# Patient Record
Sex: Male | Born: 1995 | Race: White | Hispanic: No | Marital: Single | Smoking: Current every day smoker
Health system: Southern US, Community
[De-identification: ages and names within clinical notes are randomized; demographics above are authoritative.]

## PROBLEM LIST (undated history)

## (undated) DIAGNOSIS — M67431 Ganglion, right wrist: Secondary | ICD-10-CM

## (undated) DIAGNOSIS — B2790 Infectious mononucleosis, unspecified without complication: Secondary | ICD-10-CM

## (undated) DIAGNOSIS — T7840XA Allergy, unspecified, initial encounter: Secondary | ICD-10-CM

## (undated) HISTORY — DX: Ganglion, right wrist: M67.431

## (undated) HISTORY — DX: Allergy, unspecified, initial encounter: T78.40XA

## (undated) HISTORY — PX: GANGLION CYST EXCISION: SHX1691

---
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
## 2016-11-21 ENCOUNTER — Ambulatory Visit: Payer: Medicaid Other | Admitting: Physician Assistant

## 2016-11-29 ENCOUNTER — Ambulatory Visit (INDEPENDENT_AMBULATORY_CARE_PROVIDER_SITE_OTHER): Payer: Self-pay | Admitting: Physician Assistant

## 2016-11-29 ENCOUNTER — Encounter: Payer: Self-pay | Admitting: Physician Assistant

## 2016-11-29 VITALS — BP 134/86 | HR 67 | Ht 69.0 in | Wt 169.0 lb

## 2016-11-29 DIAGNOSIS — M67431 Ganglion, right wrist: Secondary | ICD-10-CM | POA: Insufficient documentation

## 2016-11-29 DIAGNOSIS — Z0000 Encounter for general adult medical examination without abnormal findings: Secondary | ICD-10-CM

## 2016-11-29 NOTE — Patient Instructions (Addendum)
Follow-up with Dr. Darene Lamer (sports medicine) as needed for injections of your ganglion cyst  Preventive Care 18-39 Years, Male Preventive care refers to lifestyle choices and visits with your health care provider that can promote health and wellness. What does preventive care include?  A yearly physical exam. This is also called an annual well check.  Dental exams once or twice a year.  Routine eye exams. Ask your health care provider how often you should have your eyes checked.  Personal lifestyle choices, including:  Daily care of your teeth and gums.  Regular physical activity.  Eating a healthy diet.  Avoiding tobacco and drug use.  Limiting alcohol use.  Practicing safe sex. What happens during an annual well check? The services and screenings done by your health care provider during your annual well check will depend on your age, overall health, lifestyle risk factors, and family history of disease. Counseling  Your health care provider may ask you questions about your:  Alcohol use.  Tobacco use.  Drug use.  Emotional well-being.  Home and relationship well-being.  Sexual activity.  Eating habits.  Work and work Statistician. Screening  You may have the following tests or measurements:  Height, weight, and BMI.  Blood pressure.  Lipid and cholesterol levels. These may be checked every 5 years starting at age 25.  Diabetes screening. This is done by checking your blood sugar (glucose) after you have not eaten for a while (fasting).  Skin check.  Hepatitis C blood test.  Hepatitis B blood test.  Sexually transmitted disease (STD) testing. Discuss your test results, treatment options, and if necessary, the need for more tests with your health care provider. Vaccines  Your health care provider may recommend certain vaccines, such as:  Influenza vaccine. This is recommended every year.  Tetanus, diphtheria, and acellular pertussis (Tdap, Td) vaccine.  You may need a Td booster every 10 years.  Varicella vaccine. You may need this if you have not been vaccinated.  HPV vaccine. If you are 20 or younger, you may need three doses over 6 months.  Measles, mumps, and rubella (MMR) vaccine. You may need at least one dose of MMR.You may also need a second dose.  Pneumococcal 13-valent conjugate (PCV13) vaccine. You may need this if you have certain conditions and have not been vaccinated.  Pneumococcal polysaccharide (PPSV23) vaccine. You may need one or two doses if you smoke cigarettes or if you have certain conditions.  Meningococcal vaccine. One dose is recommended if you are age 20-20 years and a first-year college student living in a residence hall, or if you have one of several medical conditions. You may also need additional booster doses.  Hepatitis A vaccine. You may need this if you have certain conditions or if you travel or work in places where you may be exposed to hepatitis A.  Hepatitis B vaccine. You may need this if you have certain conditions or if you travel or work in places where you may be exposed to hepatitis B.  Haemophilus influenzae type b (Hib) vaccine. You may need this if you have certain risk factors. Talk to your health care provider about which screenings and vaccines you need and how often you need them. This information is not intended to replace advice given to you by your health care provider. Make sure you discuss any questions you have with your health care provider. Document Released: 11/27/2001 Document Revised: 06/20/2016 Document Reviewed: 08/02/2015 Elsevier Interactive Patient Education  2017 [employer].

## 2016-11-29 NOTE — Progress Notes (Signed)
HPI:                                                                Seth SnooksBrandan Torres is a 21 y.o. male [facility] today to establish care  Current Concerns include right ganglion cyst on dorsum of wrist that has been present for 8 years. It causes him intermittent pain. He would like to have this removed if possible.  He has no known medical problems. No surgeries. He has no family history of HTN, DMII, heart disease or cancer to his knowledge.  Currently sexually active with 1 male partner. Declines STI testing.  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  Health Maintenance Health Maintenance  Topic Date Due  . HIV Screening  09/30/2012  . TETANUS/TDAP  09/30/2016  . INFLUENZA VACCINE  06/15/2018 (Originally 05/15/2016)     Health Habits  Diet: good  Exercise: gym 1 hour daily  ETOH: no  Tobacco: e-cigarettes  Drugs: no  No past medical history on file. No past surgical history on file. Social History  Substance Use Topics  . Smoking status: Current Every Day Smoker    Types: E-cigarettes  . Smokeless tobacco: Never Used  . Alcohol use Yes   family history is not on file.  ROS: negative except as noted in the HPI  Medications: No current outpatient prescriptions on file.   No current facility-administered medications for this visit.    No Known Allergies     Objective:  BP 134/86   Pulse 67   Ht 5\' 9"  (1.753 m)   Wt 169 lb (76.7 kg)   BMI 24.96 kg/m  Gen: well-groomed, cooperative, not ill-appearing, no distress HEENT: normal conjunctiva, TM's clear, oropharynx clear, moist mucus membranes, no thyromegaly or tenderness Pulm: Normal work of breathing, clear to auscultation bilaterally CV: Normal rate, regular rhythm, s1 and s2 distinct, no murmurs, clicks or rubs appreciated on this exam, no carotid bruit GI: soft, nondistended, nontender, no masses Neuro: alert and oriented x 3, EOM's  intact, PERRLA, DTR's intact MSK: strength 5/5 and symmetric in upper and lower extremities, normal gait and station, distal pulses intact, no peripheral edema Right Wrist: visible and palpable ganglion cyst on dorsum (see Dr. Lucienne Minkshekkekandam's note) Skin: warm and dry, no rashes or lesions on exposed skin Psych: flat affect, pleasant mood, normal speech and thought content   No results found for this or any previous visit (from the past 72 hour(s)). No results found.    Assessment and Plan: 21 y.o. male with   Ganglion cyst of dorsum of right wrist - Kenalog/Lidocaine injection performed under ultrasound guidance by Dr. Velva Harmanhekkakandam - work note provided - follow-up with Sports Medicine as needed   Encounter for preventive care - Tetanus up to date - Declined influenza vaccine - Declined STI testing - patient is self-pay. Will defer screening lipid panel and CMP   No orders of the defined types were placed in this encounter.   Patient education and anticipatory guidance given Patient agrees with treatment plan Follow-up in 1 year or sooner as needed  Levonne Hubertharley E. Valma Rotenberg PA-C

## 2016-11-29 NOTE — Assessment & Plan Note (Signed)
Injection as above. Return in one month. 

## 2016-11-29 NOTE — Progress Notes (Signed)
   Subjective:    I'm seeing this patient as a consultation for:  Seth Frayharley Cummings, PA-C  CC: Right wrist ganglion cyst  HPI: This is a pleasant 21 year old male, he comes in with a long history of a ganglion cyst over the dorsum of his right wrist. He's tried sticking needles in it but unfortunately recurs, there is mild pain, moderate, persistent without radiation.  Past medical history:  Negative.  See flowsheet/record as well for more information.  Surgical history: Negative.  See flowsheet/record as well for more information.  Family history: Negative.  See flowsheet/record as well for more information.  Social history: Negative.  See flowsheet/record as well for more information.  Allergies, and medications have been entered into the medical record, reviewed, and no changes needed.   Review of Systems: No headache, visual changes, nausea, vomiting, diarrhea, constipation, dizziness, abdominal pain, skin rash, fevers, chills, night sweats, weight loss, swollen lymph nodes, body aches, joint swelling, muscle aches, chest pain, shortness of breath, mood changes, visual or auditory hallucinations.   Objective:   General: Well Developed, well nourished, and in no acute distress.  Neuro/Psych: Alert and oriented x3, extra-ocular muscles intact, able to move all 4 extremities, sensation grossly intact. Skin: Warm and dry, no rashes noted.  Respiratory: Not using accessory muscles, speaking in full sentences, trachea midline.  Cardiovascular: Pulses palpable, no extremity edema. Abdomen: Does not appear distended. Right Wrist: Visible and palpable dorsal ganglion in close approximation with a fourth extensor compartment ROM smooth and normal with good flexion and extension and ulnar/radial deviation that is symmetrical with opposite wrist. Palpation is normal over metacarpals, navicular, lunate, and TFCC; tendons without tenderness/ swelling No snuffbox tenderness. No tenderness over  [place]. Strength 5/5 in all directions without pain. Negative Finkelstein, tinel's and phalens. Negative Watson's test.  Procedure: Real-time Ultrasound Guided injection and fenestration of right dorsal wrist ganglion cyst Device: GE Logiq E  Verbal informed consent obtained.  Time-out conducted.  Noted no overlying erythema, induration, or other signs of local infection.  Skin prepped in a sterile fashion.  Local anesthesia: Topical Ethyl chloride.  With sterile technique and under real time ultrasound guidance:  Using a 25-gauge needle advanced into the ganglion cyst, injected 1/2 mL kenalog 40, 1/2 mL lidocaine, I then made several fenestrations to the cyst. Completed without difficulty  Pain immediately resolved suggesting accurate placement of the medication.  Advised to call if fevers/chills, erythema, induration, drainage, or persistent bleeding.  Images permanently stored and available for review in the ultrasound unit.  Impression: Technically successful ultrasound guided injection.  Impression and Recommendations:   This case required medical decision making of moderate complexity.  Ganglion cyst of dorsum of right wrist Injection as above. Return in one month.

## 2017-01-25 ENCOUNTER — Ambulatory Visit (INDEPENDENT_AMBULATORY_CARE_PROVIDER_SITE_OTHER): Payer: Self-pay | Admitting: Physician Assistant

## 2017-01-25 VITALS — BP 130/80 | HR 46 | Wt 164.0 lb

## 2017-01-25 DIAGNOSIS — S90212A Contusion of left great toe with damage to nail, initial encounter: Secondary | ICD-10-CM

## 2017-01-25 NOTE — Progress Notes (Signed)
HPI:                                                                Seth Torres is a 21 y.o. male who presents to [facility]: [facility] Medicine today for left toe injury  Patient reports 6 weeks ago a metal box fell on his left great toe. He had pain and bruising, but full ROM and was able to walk on his foot. He presents today because his left great toe is black and blue and he is wondering if his toe nail is supposed to fall off. He denies any pain, swelling, warmth, or erythema. He does endorse athlete's foot of the affected foot.  Past Medical History:  Diagnosis Date  . Ganglion cyst of dorsum of right wrist    No past surgical history on file. Social History  Substance Use Topics  . Smoking status: Current Every Day Smoker    Types: E-cigarettes  . Smokeless tobacco: Never Used  . Alcohol use Yes   family history is not on file.  ROS: negative except as noted in the HPI  Medications: No current outpatient prescriptions on file.   No current facility-administered medications for this visit.    No Known Allergies     Objective:  BP 130/80   Pulse (!) 46   Wt 164 lb (74.4 kg)   BMI 24.22 kg/m  Physical Exam  Musculoskeletal:       Feet:  Subungual hematoma of left great toe, nontender, brisk capillary refill, Left foot otherwise without deformity or tenderness; Normal gait and station, no peripheral edema  Skin: Skin is warm, dry and intact. Rash (scaling and erythema of the dorsum of the left forefoot) noted.      No results found for this or any previous visit (from the past 72 hour(s)). No results found.    Assessment and Plan: 21 y.o. male with   1. Subungual hematoma of great toe of left foot, initial encounter - asymptomatic, not necessary to perform decompression - reassurance provided  Patient education and anticipatory guidance given Patient agrees with treatment plan Follow-up as needed if symptoms worsen  or fail to improve  Levonne Hubert PA-C

## 2017-03-07 ENCOUNTER — Ambulatory Visit (INDEPENDENT_AMBULATORY_CARE_PROVIDER_SITE_OTHER): Payer: Self-pay | Admitting: Physician Assistant

## 2017-03-07 ENCOUNTER — Ambulatory Visit (INDEPENDENT_AMBULATORY_CARE_PROVIDER_SITE_OTHER): Payer: Self-pay

## 2017-03-07 VITALS — BP 145/75 | HR 76 | Temp 98.0°F | Wt 162.0 lb

## 2017-03-07 DIAGNOSIS — S99922A Unspecified injury of left foot, initial encounter: Secondary | ICD-10-CM

## 2017-03-07 DIAGNOSIS — S92155A Nondisplaced avulsion fracture (chip fracture) of left talus, initial encounter for closed fracture: Secondary | ICD-10-CM | POA: Insufficient documentation

## 2017-03-07 DIAGNOSIS — X58XXXA Exposure to other specified factors, initial encounter: Secondary | ICD-10-CM

## 2017-03-07 DIAGNOSIS — S92152A Displaced avulsion fracture (chip fracture) of left talus, initial encounter for closed fracture: Secondary | ICD-10-CM

## 2017-03-07 NOTE — Progress Notes (Signed)
Pt was running down stairs yesterday around 5 pm, and left foot became very painful.  Is bruised across the top, and has pain with any movement.  The pain is located across the top, and along lateral side.

## 2017-03-07 NOTE — Patient Instructions (Addendum)
-   ASO brace (lace-up ankle brace) - Ice x 20 mins 3-4 times per day (do not apply ice directly to skin - use a thin towel) - Elevate injured foot - Continue range of motion as much as you can without pain (do the ABC's with your foot) - Ibuprofen 600mg  every 8 hours as needed for pain   Ankle Sprain An ankle sprain is a stretch or tear in one of the tough tissues (ligaments) in your ankle. Follow these instructions at home:  Rest your ankle.  Take over-the-counter and prescription medicines only as told by your doctor.  For 2-3 days, keep your ankle higher than the level of your heart (elevated) as much as possible.  If directed, put ice on the area:  Put ice in a plastic bag.  Place a towel between your skin and the bag.  Leave the ice on for 20 minutes, 2-3 times a day.  If you were given a brace:  Wear it as told.  Take it off to shower or bathe.  Try not to move your ankle much, but wiggle your toes from time to time. This helps to prevent swelling.  If you were given an elastic bandage (dressing):  Take it off when you shower or bathe.  Try not to move your ankle much, but wiggle your toes from time to time. This helps to prevent swelling.  Adjust the bandage to make it more comfortable if it feels too tight.  Loosen the bandage if you lose feeling in your foot, your foot tingles, or your foot gets cold and blue.  If you have crutches, use them as told by your doctor. Continue to use them until you can walk without feeling pain in your ankle. Contact a doctor if:  Your bruises or swelling are quickly getting worse.  Your pain does not get better after you take medicine. Get help right away if:  You cannot feel your toes or foot.  Your toes or your foot looks blue.  You have very bad pain that gets worse. This information is not intended to replace advice given to you by your health care provider. Make sure you discuss any questions you have with your  health care provider. Document Released: 03/19/2008 Document Revised: 03/08/2016 Document Reviewed: 05/03/2015 Elsevier Interactive Patient Education  2017 [employer]

## 2017-03-07 NOTE — Progress Notes (Addendum)
HPI:                                                                Seth SnooksBrandan Torres is a 21 y.o. male who [facility] today for left foot pain  Foot Injury   The incident occurred 12 to 24 hours ago. The incident occurred at home. The injury mechanism was a fall (running down steps). The pain is present in the left foot. Pertinent negatives include no inability to bear weight, loss of sensation, numbness or tingling. He reports no foreign bodies present. The symptoms are aggravated by palpation and weight bearing. He has tried ice for the symptoms.     Past Medical History:  Diagnosis Date  . Ganglion cyst of dorsum of right wrist    No past surgical history on file. Social History  Substance Use Topics  . Smoking status: Current Every Day Smoker    Types: E-cigarettes  . Smokeless tobacco: Never Used  . Alcohol use Yes   family history is not on file.  ROS: negative except as noted in the HPI  Medications: No current outpatient prescriptions on file.   No current facility-administered medications for this visit.    No Known Allergies     Objective:  BP (!) 145/75 (BP Location: Right Arm, Patient Position: Sitting, Cuff Size: Large)   Pulse 76   Temp 98 F (36.7 C) (Oral)   Wt 162 lb (73.5 kg)   SpO2 99%   BMI 23.92 kg/m  Gen: well-groomed, cooperative, not ill-appearing, no distress Pulm: Normal work of breathing, normal phonation CV: DP and PT pulses intact Neuro: sensation intact MSK: left foot/ankle: nontender over medial and lateral malleoli, there is tenderness over 5th metatarsal head as well as the anterior talofibular ligament, ROM intact, joint is stable, negative anterior drawer, positive talar tilt test, no ecchymoses; moving all extremities, normal gait and station, no peripheral edema Lymph: no cervical or tonsillar adenopathy Skin: warm, dry, intact; no rashes or lesions on exposed skin, no  cyanosis   No results found for this or any previous visit (from the past 72 hour(s)). Dg Foot Complete Left  Result Date: 03/07/2017 CLINICAL DATA:  Foot injury.  Left fifth metacarpal pain. EXAM: LEFT FOOT - COMPLETE 3+ VIEW COMPARISON:  None. FINDINGS: Small bone fragment noted along the dorsum of the distal talus on the lateral view, likely small avulsed fragment. No additional bony abnormality. No subluxation or dislocation. Soft tissues are intact. IMPRESSION: Small avulsed fragment off the dorsal distal talus. Electronically Signed   By: Charlett NoseKevin  Dover M.D.   On: 03/07/2017 10:57      Assessment and Plan: 21 y.o. male with   1. Closed nondisplaced avulsion fracture of left talus, initial encounter - DG Foot Complete Left showing an avulsion of the distal talus - post-op shoe - work note provided  Patient education and anticipatory guidance given Patient agrees with treatment plan Follow-up with Sports Medicine in 2 weeks or sooner as needed if symptoms worsen or fail to improve  Levonne Hubertharley E. Sandee Bernath PA-C

## 2017-03-18 ENCOUNTER — Ambulatory Visit (INDEPENDENT_AMBULATORY_CARE_PROVIDER_SITE_OTHER): Payer: Self-pay | Admitting: Sports Medicine

## 2017-03-18 DIAGNOSIS — S92155D Nondisplaced avulsion fracture (chip fracture) of left talus, subsequent encounter for fracture with routine healing: Secondary | ICD-10-CM

## 2017-03-18 NOTE — Assessment & Plan Note (Signed)
Minimal pain. We can treat this like a sprain at this point. ASO, rehabilitation exercises. 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

## 2017-03-18 NOTE — Progress Notes (Signed)
   Subjective:    I'm seeing this patient as a consultation for:  Gena Frayharley Cummings, PA-C  CC: Left ankle injury  HPI: This is a pleasant 21 year old male, he inverted his foot recently, had immediate pain and swelling, x-ray showed a small avulsion from the dorsal lateral talus. Overall his pain is improved and [place], mild, improving. No radiation.  Past medical history:  Negative.  See flowsheet/record as well for more information.  Surgical history: Negative.  See flowsheet/record as well for more information.  Family history: Negative.  See flowsheet/record as well for more information.  Social history: Negative.  See flowsheet/record as well for more information.  Allergies, and medications have been entered into the medical record, reviewed, and no changes needed.   Review of Systems: No headache, visual changes, nausea, vomiting, diarrhea, constipation, dizziness, abdominal pain, skin rash, fevers, chills, night sweats, weight loss, swollen lymph nodes, body aches, joint swelling, muscle aches, chest pain, shortness of breath, mood changes, visual or auditory hallucinations.   Objective:   General: Well Developed, well nourished, and in no acute distress.  Neuro/Psych: Alert and oriented x3, extra-ocular muscles intact, able to move all 4 extremities, sensation grossly intact. Skin: Warm and dry, no rashes noted.  Respiratory: Not using accessory muscles, speaking in full sentences, trachea midline.  Cardiovascular: Pulses palpable, no extremity edema. Abdomen: Does not appear distended. Left Ankle: No visible erythema or swelling. Range of motion is full in all directions. Strength is 5/5 in all directions. Stable lateral and medial ligaments; squeeze test and kleiger test unremarkable; Minimal tenderness over the dorsolateral talus No pain at base of 5th MT; No tenderness over cuboid; No tenderness over N spot or navicular prominence No tenderness on posterior aspects of  lateral and medial malleolus No sign of peroneal tendon subluxations; Negative tarsal tunnel tinel's Able to walk 4 steps.  Impression and Recommendations:   This case required medical decision making of moderate complexity.  Closed nondisplaced avulsion fracture of left talus Minimal pain. We can treat this like a sprain at this point. ASO, rehabilitation exercises. 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

## 2017-04-08 ENCOUNTER — Ambulatory Visit (INDEPENDENT_AMBULATORY_CARE_PROVIDER_SITE_OTHER): Payer: Self-pay

## 2017-04-08 ENCOUNTER — Encounter: Payer: Self-pay | Admitting: Family Medicine

## 2017-04-08 ENCOUNTER — Ambulatory Visit (INDEPENDENT_AMBULATORY_CARE_PROVIDER_SITE_OTHER): Payer: Self-pay | Admitting: Family Medicine

## 2017-04-08 VITALS — BP 127/75 | HR 66 | Wt 161.0 lb

## 2017-04-08 DIAGNOSIS — X501XXA Overexertion from prolonged static or awkward postures, initial encounter: Secondary | ICD-10-CM

## 2017-04-08 DIAGNOSIS — S92155A Nondisplaced avulsion fracture (chip fracture) of left talus, initial encounter for closed fracture: Secondary | ICD-10-CM

## 2017-04-08 NOTE — Patient Instructions (Signed)
Thank you for coming in today. I think this is more like an ankle sprain.   Advance activity as tolerated.    Ankle Sprain, Phase I Rehab Ask your health care provider which exercises are safe for you. Do exercises exactly as told by your health care provider and adjust them as directed. It is normal to feel mild stretching, pulling, tightness, or discomfort as you do these exercises, but you should stop right away if you feel sudden pain or your pain gets worse.Do not begin these exercises until told by your health care provider. Stretching and range of motion exercises These exercises warm up your muscles and joints and improve the movement and flexibility of your lower leg and ankle. These exercises also help to relieve pain and stiffness. Exercise A: Gastroc and soleus stretch  1. Sit on the floor with your left / right leg extended. 2. Loop a belt or towel around the ball of your left / right foot. The ball of your foot is on the walking surface, right under your toes. 3. Keep your left / right ankle and foot relaxed and keep your knee straight while you use the belt or towel to pull your foot toward you. You should feel a gentle stretch behind your calf or knee. 4. Hold this position for __________ seconds, then release to the starting position. Repeat the exercise with your knee bent. You can put a pillow or a rolled bath towel under your knee to support it. You should feel a stretch deep in your calf or at your Achilles tendon. Repeat each stretch __________ times. Complete these stretches __________ times a day. Exercise B: Ankle alphabet  1. Sit with your left / right leg supported at the lower leg. ? Do not rest your foot on anything. ? Make sure your foot has room to move freely. 2. Think of your left / right foot as a paintbrush, and move your foot to trace each letter of the alphabet in the air. Keep your hip and knee still while you trace. Make the letters as large as you can  without feeling discomfort. 3. Trace every letter from A to Z. Repeat __________ times. Complete this exercise __________ times a day. Strengthening exercises These exercises build strength and endurance in your ankle and lower leg. Endurance is the ability to use your muscles for a long time, even after they get tired. Exercise C: Dorsiflexors  1. Secure a rubber exercise band or tube to an object, such as a table leg, that will stay still when the band is pulled. Secure the other end around your left / right foot. 2. Sit on the floor facing the object, with your left / right leg extended. The band or tube should be slightly tense when your foot is relaxed. 3. Slowly bring your foot toward you, pulling the band tighter. 4. Hold this position for __________ seconds. 5. Slowly return your foot to the starting position. Repeat __________ times. Complete this exercise __________ times a day. Exercise D: Plantar flexors  1. Sit on the floor with your left / right leg extended. 2. Loop a rubber exercise tube or band around the ball of your left / right foot. The ball of your foot is on the walking surface, right under your toes. ? Hold the ends of the band or tube in your hands. ? The band or tube should be slightly tense when your foot is relaxed. 3. Slowly point your foot and toes downward, pushing  them away from you. 4. Hold this position for __________ seconds. 5. Slowly return your foot to the starting position. Repeat __________ times. Complete this exercise __________ times a day. Exercise E: Evertors 1. Sit on the floor with your legs straight out in front of you. 2. Loop a rubber exercise band or tube around the ball of your left / right foot. The ball of your foot is on the walking surface, right under your toes. ? Hold the ends of the band in your hands, or secure the band to a stable object. ? The band or tube should be slightly tense when your foot is relaxed. 3. Slowly push your  foot outward, away from your other leg. 4. Hold this position for __________ seconds. 5. Slowly return your foot to the starting position. Repeat __________ times. Complete this exercise __________ times a day. This information is not intended to replace advice given to you by your health care provider. Make sure you discuss any questions you have with your health care provider. Document Released: 05/02/2005 Document Revised: 06/07/2016 Document Reviewed: 08/15/2015 Elsevier Interactive Patient Education  2018 [employer].

## 2017-04-08 NOTE — Progress Notes (Signed)
   Seth Torres is a 21 y.o. male who [facility] today for left foot injury. Patient has been seen several times for symptoms consistent with an ankle sprain involving his left foot. This was eventually worked up as an avulsion fracture at the lateral dorsal talar neck. He used quite well until recently when he resumed running. He tripped or twisted his ankle and felt a pop and return of pain. He has pain is located in the anterior lateral dorsal midfoot. He has not tried any treatment yet.   Past Medical History:  Diagnosis Date  . Ganglion cyst of dorsum of right wrist    No past surgical history on file. Social History  Substance Use Topics  . Smoking status: Current Every Day Smoker    Types: E-cigarettes  . Smokeless tobacco: Never Used  . Alcohol use Yes     ROS:  As above   Medications: No current outpatient prescriptions on file.   No current facility-administered medications for this visit.    No Known Allergies   Exam:  BP 127/75   Pulse 66   Wt 161 lb (73 kg)   SpO2 99%   BMI 23.78 kg/m  General: Well Developed, well nourished, and in no acute distress.  Neuro/Psych: Alert and oriented x3, extra-ocular muscles intact, able to move all 4 extremities, sensation grossly intact. Skin: Warm and dry, no rashes noted.  Respiratory: Not using accessory muscles, speaking in full sentences, trachea midline.  Cardiovascular: Pulses palpable, no extremity edema. Abdomen: Does not appear distended. MSK: Left foot slightly swollen and tender at the dorsal lateral midfoot. Stable ligamentous exam. Pulses capillary refill and sensation are intact distally    No results found for this or any previous visit (from the past 48 hour(s)). Dg Ankle Complete Left  Result Date: 04/08/2017 CLINICAL DATA:  Twisting injury while running yesterday with pain, initial encounter EXAM: LEFT ANKLE COMPLETE - 3+ VIEW COMPARISON:   03/07/2017 FINDINGS: Small avulsions are again identified at the dorsal aspect of the talus stable from the previous exam. Mild soft tissue swelling is noted in this region as well. No other bony abnormality is seen. IMPRESSION: Small avulsions from the distal talus as described. Soft tissue swelling is noted as well. Electronically Signed   By: Alcide CleverMark  Lukens M.D.   On: 04/08/2017 16:19      Assessment and Plan: 21 y.o. male with likely reinjury of healing ATFL tendon avulsion.  Plan to resume mobilization with a ankle brace and resume gradual activity as tolerated. Recommend against running for at least a month.    Orders Placed This Encounter  Procedures  . DG Ankle Complete Left    Standing Status:   Future    Number of Occurrences:   1    Standing Expiration Date:   06/08/2018    Order Specific Question:   Reason for Exam (SYMPTOM  OR DIAGNOSIS REQUIRED)    Answer:   re-injury likely talus anvusion    Order Specific Question:   Preferred imaging location?    [facility]    Order Specific Question:   Radiology Contrast Protocol - do NOT remove file path    Answer:   [email or web address]   No orders of the defined types were placed in this encounter.   Discussed warning signs or symptoms. Please see discharge instructions. Patient expresses understanding.

## 2017-04-16 ENCOUNTER — Emergency Department
Admission: EM | Admit: 2017-04-16 | Discharge: 2017-04-16 | Disposition: A | Discharge status: Home / Self Care | Payer: Self-pay | Source: Home / Self Care | Attending: Family Medicine | Admitting: Family Medicine

## 2017-04-16 ENCOUNTER — Encounter: Payer: Self-pay | Admitting: *Deleted

## 2017-04-16 DIAGNOSIS — L03111 Cellulitis of right axilla: Secondary | ICD-10-CM

## 2017-04-16 MED ORDER — CLINDAMYCIN HCL 300 MG PO CAPS: ORAL_CAPSULE | ORAL | 0 refills | Status: DC

## 2017-04-16 NOTE — ED Provider Notes (Signed)
[facility]    CSN: [id number] Arrival date & time: 04/16/17  1716     History   Chief Complaint Chief Complaint  Patient presents with  . Abscess    HPI Seth Torres is a 21 y.o. male.   Patient complains of two painful nodules in his right axilla that appeared two days ago.  No fevers, chills, and sweats.  No drainage from the lesions.   The history is provided by the patient.  Abscess  Abscess location: right axilla. Size:  1cm Abscess quality: induration and painful   Abscess quality: not draining, no fluctuance, no redness, no warmth and not weeping   Red streaking: no   Duration:  2 days Progression:  Worsening Pain details:    Quality:  Dull and pressure   Severity:  Mild   Timing:  Constant   Progression:  Worsening Chronicity:  New Context: not insect bite/sting and not skin injury   Relieved by:  None tried Exacerbated by: contact. Ineffective treatments:  None tried Associated symptoms: no fatigue, no fever and no nausea   Risk factors: no prior abscess     Past Medical History:  Diagnosis Date  . Ganglion cyst of dorsum of right wrist     Patient Active Problem List   Diagnosis Date Noted  . Closed nondisplaced avulsion fracture of left talus 03/07/2017  . Ganglion cyst of dorsum of right wrist 11/29/2016    History reviewed. No pertinent surgical history.     Home Medications    Prior to Admission medications   Medication Sig Start Date End Date Taking? Authorizing Provider  clindamycin (CLEOCIN) 300 MG capsule Take one cap by mouth every 8 hours 04/16/17   Lattie HawBeese, Misk Galentine A, MD    Family History History reviewed. No pertinent family history.  Social History Social History  Substance Use Topics  . Smoking status: Current Every Day Smoker    Types: E-cigarettes  . Smokeless tobacco: Never Used  . Alcohol use Yes     Allergies   Patient has no known allergies.   Review of Systems Review of Systems    Constitutional: Negative for fatigue and fever.  Gastrointestinal: Negative for nausea.  All other systems reviewed and are negative.    Physical Exam Triage Vital Signs ED Triage Vitals  Enc Vitals Group     BP 04/16/17 1741 115/74     Pulse Rate 04/16/17 1741 (!) 52     Resp --      Temp 04/16/17 1741 98.3 F (36.8 C)     Temp Source 04/16/17 1741 Oral     SpO2 04/16/17 1741 98 %     Weight 04/16/17 1741 158 lb 6.4 oz (71.8 kg)     Height --      Head Circumference --      Peak Flow --      Pain Score 04/16/17 1742 8     Pain Loc --      Pain Edu? --      Excl. in GC? --    No data found.   Updated Vital Signs BP 115/74 (BP Location: Left Arm)   Pulse (!) 52   Temp 98.3 F (36.8 C) (Oral)   Wt 158 lb 6.4 oz (71.8 kg)   SpO2 98%   BMI 23.39 kg/m   Visual Acuity Right Eye Distance:   Left Eye Distance:   Bilateral Distance:    Right Eye Near:   Left Eye Near:  Bilateral Near:     Physical Exam  HENT:  Head: Normocephalic.  Right Ear: External ear normal.  Left Ear: External ear normal.  Nose: Nose normal.  Mouth/Throat: Oropharynx is clear and moist.  Eyes: Conjunctivae are normal. Pupils are equal, round, and reactive to light.  Neck: Neck supple.  Cardiovascular: Normal rate.   Pulmonary/Chest: Effort normal.  Lymphadenopathy:    He has no cervical adenopathy.  Neurological: He is alert.  Skin: Skin is warm and dry. No rash noted.  Right axilla reveals a 1cm diameter tender subcutaneous nodule without induration or fluctuance, and no overlying erythema.  There is an adjacent tender reactive axillary node.   Nursing note and vitals reviewed.    UC Treatments / Results  Labs (all labs ordered are listed, but only abnormal results are displayed) Labs Reviewed - No data to display  EKG  EKG Interpretation None       Radiology No results found.  Procedures Procedures (including critical care time)  Medications Ordered in  UC Medications - No data to display   Initial Impression / Assessment and Plan / UC Course  I have reviewed the triage vital signs and the nursing notes.  Pertinent labs & imaging results that were available during my care of the patient were reviewed by me and considered in my medical decision making (see chart for details).    Lesion right axilla indurated, but not fluctuant, and should respond to antibiotic. Begin Clindamycin 300mg  Q8hr. Apply warm compress 3 or 4 times daily.  May take Ibuprofen 200mg , 4 tabs every 8 hours with food.  Return if not improving 2 to 3 days; may need I and D.    Final Clinical Impressions(s) / UC Diagnoses   Final diagnoses:  Cellulitis of axilla, right    New Prescriptions New Prescriptions   CLINDAMYCIN (CLEOCIN) 300 MG CAPSULE    Take one cap by mouth every 8 hours     Lattie Haw, MD 04/24/17 [phone]

## 2017-04-16 NOTE — ED Triage Notes (Signed)
Patient c/o 2 days of possible abscess to right axilla. He feels otherwise well.

## 2017-04-16 NOTE — Discharge Instructions (Signed)
Apply warm compress 3 or 4 times daily.  May take Ibuprofen 200mg , 4 tabs every 8 hours with food.

## 2017-04-18 ENCOUNTER — Emergency Department (INDEPENDENT_AMBULATORY_CARE_PROVIDER_SITE_OTHER)
Admission: EM | Admit: 2017-04-18 | Discharge: 2017-04-18 | Disposition: A | Discharge status: Home / Self Care | Payer: Self-pay | Source: Home / Self Care | Attending: Family Medicine | Admitting: Family Medicine

## 2017-04-18 ENCOUNTER — Encounter: Payer: Self-pay | Admitting: Emergency Medicine

## 2017-04-18 DIAGNOSIS — L02411 Cutaneous abscess of right axilla: Secondary | ICD-10-CM

## 2017-04-18 MED ORDER — TRAMADOL HCL 50 MG PO TABS: 50.0000 mg | ORAL_TABLET | Freq: Four times a day (QID) | ORAL | 0 refills | Status: DC | PRN

## 2017-04-18 NOTE — Discharge Instructions (Signed)
°  Tramadol is strong pain medication. While taking, do not drink alcohol, drive, or perform any other activities that requires focus while taking these medications.  ° °Please take antibiotics as prescribed and be sure to complete entire course even if you start to feel better to ensure infection does not come back. ° °

## 2017-04-18 NOTE — ED Provider Notes (Signed)
CSN: [id number]     Arrival date & time 04/18/17  1804 History   First MD Initiated Contact with Patient 04/18/17 1813     Chief Complaint  Patient presents with  . Abscess   (Consider location/radiation/quality/duration/timing/severity/associated sxs/prior Treatment) HPI Seth Torres is a 21 y.o. male presenting to UC with c/o gradually worsening abscesses under Right axilla that started about 5 days ago. Pt was seen 2 days ago by Dr. Cathren Harsh and started on clindamycin. He has been using warm compresses and taking the antibiotic but he feels like the two spots are only getting bigger. No bleeding or drainage. Denies fever, chills, n/v/d. Pain is throbbing, 7/10. No prior hx of skin abscesses.    Past Medical History:  Diagnosis Date  . Ganglion cyst of dorsum of right wrist    History reviewed. No pertinent surgical history. No family history on file. Social History  Substance Use Topics  . Smoking status: Current Every Day Smoker    Types: E-cigarettes  . Smokeless tobacco: Never Used  . Alcohol use Yes    Review of Systems  Constitutional: Negative for chills and fever.  Musculoskeletal: Positive for myalgias (under Right axilla). Negative for arthralgias.  Skin: Positive for color change. Negative for rash and wound.    Allergies  Patient has no known allergies.  Home Medications   Prior to Admission medications   Medication Sig Start Date End Date Taking? Authorizing Provider  clindamycin (CLEOCIN) 300 MG capsule Take one cap by mouth every 8 hours 04/16/17   Lattie Haw, MD  traMADol (ULTRAM) 50 MG tablet Take 1 tablet (50 mg total) by mouth every 6 (six) hours as needed. 04/18/17   Lurene Shadow, PA-C   Meds Ordered and Administered this Visit  Medications - No data to display  BP 122/72 (BP Location: Left Arm)   Pulse (!) 55   Temp 97.7 F (36.5 C) (Oral)   Ht 5\' 8"  (1.727 m)   Wt 158 lb (71.7 kg)   SpO2 98%   BMI 24.02 kg/m  No data found.   Physical  Exam  Constitutional: He is oriented to person, place, and time. He appears well-developed and well-nourished.  HENT:  Head: Normocephalic and atraumatic.  Eyes: EOM are normal.  Neck: Normal range of motion.  Cardiovascular: Normal rate.   Pulmonary/Chest: Effort normal.  Musculoskeletal: Normal range of motion.  Neurological: He is alert and oriented to person, place, and time.  Skin: Skin is warm and dry. There is erythema.  Right axilla: 2cm area of erythema, and tenderness with centralized fluctuance. No bleeding or drainage Smaller 1cm area of erythema with centralized fluctuance. No bleeding or drainage. No red streaking.   Psychiatric: He has a normal mood and affect. His behavior is normal.  Nursing note and vitals reviewed.   Urgent Care Course     .Marland KitchenIncision and Drainage Date/Time: 04/18/2017 6:43 PM Performed by: Lurene Shadow Authorized by: Donna Christen A   Consent:    Consent obtained:  Verbal   Consent given by:  Patient   Risks discussed:  Bleeding, incomplete drainage and pain   Alternatives discussed:  Delayed treatment Location:    Type:  Abscess   Size:  2   Location:  Upper extremity   Upper extremity location:  Arm   Arm location:  R upper arm (Right axilla) Pre-procedure details:    Skin preparation:  Betadine Anesthesia (see MAR for exact dosages):    Anesthesia method:  Local  infiltration   Local anesthetic:  Lidocaine 1% WITH epi Procedure type:    Complexity:  Simple Procedure details:    Needle aspiration: no     Incision types:  Single straight   Incision depth:  Dermal   Scalpel blade:  11   Wound management:  Probed and deloculated and irrigated with saline   Drainage:  Bloody and purulent   Drainage amount:  Moderate   Wound treatment:  Wound left open (bandage applied)   Packing materials:  None Post-procedure details:    Patient tolerance of procedure:  Tolerated well, no immediate complications .Marland Kitchen.Incision and  Drainage Date/Time: 04/18/2017 6:44 PM Performed by: Lurene ShadowPHELPS, Berna Gitto O Authorized by: Donna ChristenBEESE, STEPHEN A   Consent:    Consent obtained:  Verbal   Consent given by:  Patient   Risks discussed:  Bleeding, incomplete drainage and pain   Alternatives discussed:  Delayed treatment Location:    Type:  Abscess   Size:  1   Location:  Upper extremity   Upper extremity location:  Arm   Arm location:  R upper arm (Right axilla) Pre-procedure details:    Skin preparation:  Betadine Procedure type:    Complexity:  Simple Procedure details:    Needle aspiration: no     Incision types:  Single straight   Incision depth:  Dermal   Scalpel blade:  11   Wound management:  Probed and deloculated and irrigated with saline   Drainage:  Bloody and purulent   Drainage amount:  Scant   Wound treatment:  Wound left open   Packing materials:  None Post-procedure details:    Patient tolerance of procedure:  Tolerated well, no immediate complications   (including critical care time)  Labs Review Labs Reviewed  WOUND CULTURE    Imaging Review No results found.   MDM   1. Abscess of right axilla    I & D of two small abscesses under Right axilla. No packing needed. Pt tolerated procedure well.  Encouraged to continue taking antibiotics as prescribed. Wound culture sent to lab  F/u in 2-3 days for recheck of symptoms if not improving. Home instructions provided. Rx: Tramadol for moderate to severe pain. Ibuprofen and acetaminophen during the day    Lurene Shadowhelps, Juan Kissoon O, [place]PA-C 04/18/17 1846

## 2017-04-18 NOTE — ED Triage Notes (Signed)
Abscess Right axilla x 5 days

## 2017-04-21 ENCOUNTER — Telehealth: Payer: Self-pay | Admitting: Family Medicine

## 2017-04-21 LAB — WOUND CULTURE
Gram Stain: NONE SEEN
Gram Stain: NONE SEEN

## 2017-04-21 NOTE — Telephone Encounter (Signed)
Informed pt of staph results and to continue with current antibiotics. Pt states he is feeling much better after having abscess drained. No questions or concerns at this time.

## 2018-01-02 ENCOUNTER — Encounter: Payer: Self-pay | Admitting: Physician Assistant

## 2018-01-02 ENCOUNTER — Ambulatory Visit (INDEPENDENT_AMBULATORY_CARE_PROVIDER_SITE_OTHER): Payer: Self-pay | Admitting: Physician Assistant

## 2018-01-02 ENCOUNTER — Ambulatory Visit: Payer: Self-pay | Admitting: Physician Assistant

## 2018-01-02 ENCOUNTER — Other Ambulatory Visit: Payer: Self-pay | Admitting: Physician Assistant

## 2018-01-02 VITALS — BP 135/81 | HR 66 | Temp 98.1°F | Wt 156.0 lb

## 2018-01-02 DIAGNOSIS — Z113 Encounter for screening for infections with a predominantly sexual mode of transmission: Secondary | ICD-10-CM

## 2018-01-02 DIAGNOSIS — Z7251 High risk heterosexual behavior: Secondary | ICD-10-CM

## 2018-01-02 DIAGNOSIS — J029 Acute pharyngitis, unspecified: Secondary | ICD-10-CM

## 2018-01-02 DIAGNOSIS — R011 Cardiac murmur, unspecified: Secondary | ICD-10-CM | POA: Insufficient documentation

## 2018-01-02 DIAGNOSIS — J039 Acute tonsillitis, unspecified: Secondary | ICD-10-CM

## 2018-01-02 DIAGNOSIS — J0391 Acute recurrent tonsillitis, unspecified: Secondary | ICD-10-CM | POA: Insufficient documentation

## 2018-01-02 MED ORDER — ACETAMINOPHEN 500 MG PO CAPS: 2.0000 | ORAL_CAPSULE | Freq: Three times a day (TID) | ORAL | 0 refills | Status: DC | PRN

## 2018-01-02 MED ORDER — PREDNISONE 20 MG PO TABS: 40.0000 mg | ORAL_TABLET | Freq: Every day | ORAL | 0 refills | Status: DC

## 2018-01-02 MED ORDER — IBUPROFEN 600 MG PO TABS: 600.0000 mg | ORAL_TABLET | Freq: Three times a day (TID) | ORAL | 0 refills | Status: DC | PRN

## 2018-01-02 NOTE — Patient Instructions (Addendum)
Infectious Mononucleosis Infectious mononucleosis is an infection that is caused by a virus. This illness is often called "mono." You can get mono from close contact with someone who is infected (it is contagious). If you have mono, you may feel tired and have a sore throat, a headache, or a fever. Mono is usually not serious, but some people may need to be treated for it in the hospital. Follow these instructions at home: Medicines  Take over-the-counter and prescription medicines only as told by your doctor.  Do not take ampicillin or amoxicillin. This may cause a rash.  If you are under 18, do not take aspirin. Activity  Rest as needed.  Do not do any of the following activities until your doctor says that they are safe for you:  Contact sports. You may need to wait a month or longer before you play sports.  Exercise that requires a lot of energy.  Lifting heavy things.  Slowly go back to your normal activities after your fever is gone, or when your doctor says that you can. Be sure to rest when you get tired. Preventing infectious mononucleosis  Avoid contact with people who have mono. An infected person may not seem sick, but he or she can still spread the virus.  Avoid sharing forks, spoons, knives (utensils), drinking cups, or toothbrushes.  Wash your hands often with soap and water. If you cannot use soap and water, use hand sanitizer.  Use the inside of your elbow to cover your mouth when you cough or sneeze. General instructions  Avoid kissing or sharing forks, spoons, knives, or drinking cups until your doctor approves.  Drink enough fluid to keep your pee (urine) clear or pale yellow.  Do not drink alcohol.  If you have a sore throat:  Rinse your mouth (gargle) with a salt-water mixture 3-4 times a day or as needed. To make a salt-water mixture, completely dissolve -1 tsp of salt in 1 cup of warm water.  Eat soft foods. Cold foods such as ice cream or frozen  ice pops can help your throat feel better.  Try sucking on hard candy.  Wash your hands often with soap and water. If you cannot use soap and water, use hand sanitizer. Contact a doctor if:  Your fever is not gone after 10 days.  You have swelling by your jaw or neck (swollen lymph nodes), and the swelling does not go away after 4 weeks.  Your activity level is not back to normal after 2 months.  Your skin or the white parts of your eyes turn yellow (jaundice).  You have trouble pooping (have constipation). This may mean that you:  Poop (have a bowel movement) fewer times in a week than normal.  Have a hard time pooping.  Have poop that is dry, hard, or bigger than normal. Get help right away if:  You have very bad pain in your:  Belly (abdomen).  Shoulder.  You are drooling.  You have trouble swallowing.  You have trouble breathing.  You have a stiff neck.  You have a very bad headache.  You cannot stop throwing up (vomiting).  You have jerky movements that you cannot control (seizures).  You are confused.  You have trouble with balance.  Your nose or gums start to bleed.  You have signs of body fluid loss (dehydration). These may include:  Weakness.  Sunken eyes.  Pale skin.  Dry mouth.  Fast breathing or heartbeat. Summary  Infectious mononucleosis, or "  mono," is an infection that is caused by a virus.  Mono is usually not serious, but some people may need to be treated for it in the hospital.  You should not play contact sports or lift heavy things until your doctor says that you can.  Wash your hands often with soap and water. If you cannot use soap and water, use hand sanitizer. This information is not intended to replace advice given to you by your health care provider. Make sure you discuss any questions you have with your health care provider. Document Released: 09/19/2009 Document Revised: 06/19/2016 Document Reviewed:  06/19/2016 Elsevier Interactive Patient Education  2017 Elsevier Inc.  

## 2018-01-02 NOTE — Progress Notes (Signed)
HPI:                                                                Seth SnooksBrandan Torres is a 22 y.o. male who [facility] today for sore throat  Sore Throat   This is a new problem. The current episode started more than 1 month ago. The problem has been unchanged. There has been no fever. Associated symptoms include swollen glands. Pertinent negatives include no congestion, coughing, ear pain, headaches, hoarse voice, neck pain or vomiting. He has tried NSAIDs (steroids) for the symptoms. The treatment provided moderate relief.  Treated with Decadron, Toradol and steroid taper. Reports symptoms improved somewhat but never fully resolved. Negative throat culture on 12/21/2017  Reports 2 sexual partners in the last 2 months, does not use condoms consistently. Engages in oral and penetrative sex. Denies genital rashes/lesions, urethral discharge, or dysuria. Denies history of STI.   No flowsheet data found.  No flowsheet data found.    Past Medical History:  Diagnosis Date  . Ganglion cyst of dorsum of right wrist    No past surgical history on file. Social History   Tobacco Use  . Smoking status: Current Every Day Smoker    Types: E-cigarettes  . Smokeless tobacco: Never Used  Substance Use Topics  . Alcohol use: Yes   family history is not on file.    ROS: negative except as noted in the HPI  Medications: Current Outpatient Medications  Medication Sig Dispense Refill  . Acetaminophen 500 MG coapsule Take 2 capsules (1,000 mg total) by mouth every 8 (eight) hours as needed for fever or pain. 30 capsule 0  . ibuprofen (ADVIL,MOTRIN) 600 MG tablet Take 1 tablet (600 mg total) by mouth every 8 (eight) hours as needed. 30 tablet 0  . traMADol (ULTRAM) 50 MG tablet Take 1 tablet (50 mg total) by mouth every 6 (six) hours as needed. 15 tablet 0   No current facility-administered medications for this visit.    No Known  Allergies     Objective:  BP 135/81   Pulse 66   Temp 98.1 F (36.7 C) (Oral)   Wt 156 lb (70.8 kg)   BMI 23.72 kg/m  Gen:  alert, not ill-appearing, no distress, appropriate for age HEENT: head normocephalic without obvious abnormality, conjunctiva and cornea clear, tonsils grade 2 with erythema and edema, no exudates, tenderness in the tonsillar lymph node distribution but no palpable adenopathy, neck supple, trachea midline Pulm: Normal work of breathing, normal phonation, clear to auscultation bilaterally, no wheezes, rales or rhonchi CV: Normal rate, regular rhythm, s1 and s2 distinct, grade 2 early systolic murmur, no clicks or rubs  Neuro: alert and oriented x 3, no tremor MSK: extremities atraumatic, normal gait and station Skin: intact, no rashes on exposed skin, no jaundice, no cyanosis   No results found for this or any previous visit (from the past 72 hour(s)). No results found.    Assessment and Plan: 22 y.o. male with   1. Tonsillopharyngitis - prolonged pharyngeal pain and swelling for 4 weeks with negative throat culture.  - Epstein-Barr virus VCA antibody panel - CBC with Differential/Platelet - C. trachomatis/N. gonorrhoeae RNA - C. trachomatis/N. gonorrhoeae RNA - Hepatitis  C antibody - HIV antibody - RPR - ibuprofen (ADVIL,MOTRIN) 600 MG tablet; Take 1 tablet (600 mg total) by mouth every 8 (eight) hours as needed.  Dispense: 30 tablet; Refill: 0 - Acetaminophen 500 MG coapsule; Take 2 capsules (1,000 mg total) by mouth every 8 (eight) hours as needed for fever or pain.  Dispense: 30 capsule; Refill: 0  2. Unprotected sexual intercourse - C. trachomatis/N. gonorrhoeae RNA throat - C. trachomatis/N. gonorrhoeae RNA urine - Hepatitis C antibody - HIV antibody - RPR  3. Routine screening for STI (sexually transmitted infection) - C. trachomatis/N. gonorrhoeae RNA - C. trachomatis/N. gonorrhoeae RNA - Hepatitis C antibody - HIV antibody -  RPR  4. Cardiac murmur - early systolic murmur, asymptomatic, no syncope, palpitations, chest pain, exercise intolerance, cyanosis. He is self-pay and will not be able to afford an echocardiogram at this time. Recommend follow-up echo as soon as financially feasible  Patient education and anticipatory guidance given Patient agrees with treatment plan Follow-up as needed if symptoms worsen or fail to improve  Levonne Hubert PA-C

## 2018-01-03 ENCOUNTER — Telehealth: Payer: Self-pay | Admitting: Emergency Medicine

## 2018-01-03 LAB — TIQ-NTM

## 2018-01-03 LAB — HEPATITIS C ANTIBODY
HEP C AB: NONREACTIVE
SIGNAL TO CUT-OFF: 0.01 (ref <1.00)

## 2018-01-03 LAB — RPR: RPR Ser Ql: NONREACTIVE

## 2018-01-03 LAB — HIV ANTIBODY (ROUTINE TESTING W REFLEX): HIV 1&2 Ab, 4th Generation: NONREACTIVE

## 2018-01-03 LAB — C. TRACHOMATIS/N. GONORRHOEAE RNA
C. trachomatis RNA, TMA: NOT DETECTED
N. gonorrhoeae RNA, TMA: NOT DETECTED

## 2018-01-06 ENCOUNTER — Encounter: Payer: Self-pay | Admitting: Physician Assistant

## 2018-01-06 DIAGNOSIS — B2790 Infectious mononucleosis, unspecified without complication: Secondary | ICD-10-CM | POA: Insufficient documentation

## 2018-01-06 DIAGNOSIS — J038 Acute tonsillitis due to other specified organisms: Secondary | ICD-10-CM

## 2018-01-06 LAB — C. TRACHOMATIS/N. GONORRHOEAE RNA
C. trachomatis RNA, TMA: NOT DETECTED
N. GONORRHOEAE RNA, TMA: NOT DETECTED

## 2018-01-06 LAB — CBC WITH DIFFERENTIAL/PLATELET
Basophils Absolute: 55 cells/uL (ref 0–200)
Basophils Relative: 0.5 %
EOS PCT: 1.2 %
Eosinophils Absolute: 132 cells/uL (ref 15–500)
HCT: 43.9 % (ref 38.5–50.0)
HEMOGLOBIN: 14.7 g/dL (ref 13.2–17.1)
Lymphs Abs: 1892 cells/uL (ref 850–3900)
MCH: 25.4 pg — ABNORMAL LOW (ref 27.0–33.0)
MCHC: 33.5 g/dL (ref 32.0–36.0)
MCV: 75.8 fL — AB (ref 80.0–100.0)
MPV: 11.9 fL (ref 7.5–12.5)
Monocytes Relative: 7.6 %
NEUTROS ABS: 8085 {cells}/uL — AB (ref 1500–7800)
NEUTROS PCT: 73.5 %
Platelets: 212 10*3/uL (ref 140–400)
RBC: 5.79 10*6/uL (ref 4.20–5.80)
RDW: 15.4 % — AB (ref 11.0–15.0)
Total Lymphocyte: 17.2 %
WBC: 11 10*3/uL — ABNORMAL HIGH (ref 3.8–10.8)
WBCMIX: 836 {cells}/uL (ref 200–950)

## 2018-01-06 LAB — RPR

## 2018-01-06 LAB — HEPATITIS C ANTIBODY

## 2018-01-06 LAB — EPSTEIN-BARR VIRUS VCA ANTIBODY PANEL
EBV NA IgG: <18 U/mL
EBV VCA IGG: 413 U/mL — AB
EBV VCA IgM: <36 U/mL

## 2018-01-06 LAB — HIV ANTIBODY (ROUTINE TESTING W REFLEX)

## 2018-01-06 NOTE — Telephone Encounter (Signed)
His lab results are still in process

## 2018-01-06 NOTE — Telephone Encounter (Signed)
Pt called stating that he was seen last week and is still not feeling any better. Pt wanting to know if there are any lab results available or any new recommendations for him. Please advise. Thanks.

## 2018-01-06 NOTE — Telephone Encounter (Signed)
Left pt VM just letting him know labs are pending and we will call him when we have results.

## 2018-01-06 NOTE — Progress Notes (Signed)
Labs show epstein barr virus also known as infectious mono This is a viral illness that causes fatigue and sore throat. Unfortunately there is no medication that can speed recovery Treatment is symptomatic - drink plenty of unsweetened, non-caffeinated fluids  - relative rest - pace activity for the day - Ibuprofen 600 mg every 6 hours and Tylenol 1000 mg every 8 hours as needed for pain (Avoid aspirin) - avoid strenuous exercise or contact sports Follow-up if having difficulty swallowing

## 2018-01-09 LAB — GC/CHLAMYDIA PROBE, AMP (THROAT)
Chlamydia trachomatis RNA: NOT DETECTED
Neisseria gonorrhoeae RNA: NOT DETECTED

## 2018-02-24 ENCOUNTER — Emergency Department (INDEPENDENT_AMBULATORY_CARE_PROVIDER_SITE_OTHER)
Admission: EM | Admit: 2018-02-24 | Discharge: 2018-02-24 | Disposition: A | Discharge status: Home / Self Care | Payer: Managed Care, Other (non HMO) | Source: Home / Self Care

## 2018-02-24 ENCOUNTER — Other Ambulatory Visit: Payer: Self-pay

## 2018-02-24 DIAGNOSIS — R21 Rash and other nonspecific skin eruption: Secondary | ICD-10-CM | POA: Diagnosis not present

## 2018-02-24 MED ORDER — TERBINAFINE HCL 250 MG PO TABS: 250.0000 mg | ORAL_TABLET | Freq: Every day | ORAL | 0 refills | Status: DC

## 2018-02-24 MED ORDER — NYSTATIN-TRIAMCINOLONE 100000-0.1 UNIT/GM-% EX CREA: TOPICAL_CREAM | CUTANEOUS | 0 refills | Status: DC

## 2018-02-24 NOTE — ED Provider Notes (Signed)
[facility]    CSN: [id number] Arrival date & time: 02/24/18  1856     History   Chief Complaint Chief Complaint  Patient presents with  . Rash    Genital area    HPI Seth Torres is a 22 y.o. male.  For several days the patient has had a rash on his scrotum.  It has had a little bit of a white flakiness to it and it itches.  The rash does not extend down into the creases of the legs.  His girlfriend has not had any similar symptoms.  He does not have any penile discharge or burning when he urinates.  No lesions or sores on his penis.  He has been with the same girlfriend for the past 3 months.  He is started using some baby he diaper ointment on his scrotum.  Otherwise he is a healthy man. HPI  Past Medical History:  Diagnosis Date  . Ganglion cyst of dorsum of right wrist     Patient Active Problem List   Diagnosis Date Noted  . Acute tonsillitis due to infectious mononucleosis 01/06/2018  . Cardiac murmur 01/02/2018  . Closed nondisplaced avulsion fracture of left talus 03/07/2017  . Ganglion cyst of dorsum of right wrist 11/29/2016    No past surgical history on file.     Home Medications    Prior to Admission medications   Medication Sig Start Date End Date Taking? Authorizing Provider  Acetaminophen 500 MG coapsule Take 2 capsules (1,000 mg total) by mouth every 8 (eight) hours as needed for fever or pain. 01/02/18   Carlis Stable, PA-C  ibuprofen (ADVIL,MOTRIN) 600 MG tablet Take 1 tablet (600 mg total) by mouth every 8 (eight) hours as needed. 01/02/18   Carlis Stable, PA-C  nystatin-triamcinolone [facility] II) cream Apply a small amount twice daily on affected skin 02/24/18   Peyton Najjar, MD  terbinafine (LAMISIL) 250 MG tablet Take 1 tablet (250 mg total) by mouth daily. 02/24/18   Peyton Najjar, MD  traMADol (ULTRAM) 50 MG tablet Take 1 tablet (50 mg total) by mouth every 6 (six) hours as needed. 04/18/17   Lurene Shadow, PA-C    Family History No family history on file.  Social History Social History   Tobacco Use  . Smoking status: Current Every Day Smoker    Types: E-cigarettes  . Smokeless tobacco: Never Used  Substance Use Topics  . Alcohol use: Yes  . Drug use: No     Allergies   Patient has no known allergies.   Review of Systems Review of Systems Constitutional: Unremarkable Dermatologic: No other rashes than noted above GU: No complaints  Physical Exam Triage Vital Signs ED Triage Vitals  Enc Vitals Group     BP 02/24/18 1932 129/71     Pulse Rate 02/24/18 1932 81     Resp --      Temp 02/24/18 1932 98.3 F (36.8 C)     Temp Source 02/24/18 1932 Oral     SpO2 02/24/18 1932 100 %     Weight 02/24/18 1933 155 lb (70.3 kg)     Height 02/24/18 1933  (1.753 m)     Head Circumference --      Peak Flow --      PainIvar Drape9 1932 0     Pain Loc --      Pain Edu? --      Excl. in GC? --  No data found.  Updated Vital Signs BP 129/71 (BP Location: Left Arm)   Pulse 81   Temp 98.3 F (36.8 C) (Oral)   Ht  (1.753 m)   Wt 155 lb (70.3 kg)   SpO2 100%   BMI 22.89 kg/m   Visual Acuity Right Eye Distance:   Left Eye Distance:   Bilateral Distance:    Right Eye Near:   Left Eye Near:    Bilateral Near:     Physical Exam Healthy-appearing young man no distress.  Penis normal.  No hernias.  Mild tenderness to palpation of the testicles.  There is a little erythema of the whole scrotum.  With the whitish-cream on the scrotum it is a little hard to tell much about the rash but is very thin rash.  No lice were noted.  The creases of the groin were normal with no rash extending down into the perineal area.  UC Treatments / Results  Labs (all labs ordered are listed, but only abnormal results are displayed) Labs Reviewed - No data to display  EKG None  Radiology No results found.  Procedures Procedures (including critical care  time)  Medications Ordered in UC Medications - No data to display  Initial Impression / Assessment and Plan / UC Course  I have reviewed the triage vital signs and the nursing notes.  Pertinent labs & imaging results that were available during my care of the patient were reviewed by me and considered in my medical decision making (see chart for details).     Nonspecific scrotal dermatitis, possibly allergic versus fungal.  Will treat accordingly. Final Clinical Impressions(s) / UC Diagnoses   Final diagnoses:  Rash and nonspecific skin eruption     Discharge Instructions     Use the nystatin/triamcinolone cream a small amount on the scrotum twice daily.  Do not use for more than 5 days.  Take terbinafine 250 mg 1 daily  Try to keep the area dry, and if necessary change your underwear when sweaty.  If it is getting worse or no better, come in after a shower to get rechecked when you do not have any cream or ointment on the scrotum.    ED Prescriptions    Medication Sig Dispense Auth. Provider   terbinafine (LAMISIL) 250 MG tablet Take 1 tablet (250 mg total) by mouth daily. 14 tablet Peyton Najjar, MD   nystatin-triamcinolone [facility] II) cream Apply a small amount twice daily on affected skin 15 g Peyton Najjar, MD     Controlled Substance Prescriptions [place] Controlled Substance Registry consulted? No   Peyton Najjar, MD 02/24/18 2019

## 2018-02-24 NOTE — Discharge Instructions (Signed)
Use the nystatin/triamcinolone cream a small amount on the scrotum twice daily.  Do not use for more than 5 days.  Take terbinafine 250 mg 1 daily  Try to keep the area dry, and if necessary change your underwear when sweaty.  If it is getting worse or no better, come in after a shower to get rechecked when you do not have any cream or ointment on the scrotum.

## 2018-02-24 NOTE — ED Triage Notes (Signed)
21 y.o male presents with rash to genital area x4 days. He states his sexual partner has vaginal swelling and dysuria. He denies dysuria, discharge, or frequency. He states when he got home from work today he noticed white, thick, discharge in his boxers. He states he has put baby ointment on his area with no relief.

## 2018-03-29 ENCOUNTER — Emergency Department (INDEPENDENT_AMBULATORY_CARE_PROVIDER_SITE_OTHER)
Admission: EM | Admit: 2018-03-29 | Discharge: 2018-03-29 | Disposition: A | Discharge status: Home / Self Care | Payer: Managed Care, Other (non HMO) | Source: Home / Self Care | Attending: Family Medicine | Admitting: Family Medicine

## 2018-03-29 ENCOUNTER — Encounter: Payer: Self-pay | Admitting: Emergency Medicine

## 2018-03-29 DIAGNOSIS — J039 Acute tonsillitis, unspecified: Secondary | ICD-10-CM

## 2018-03-29 DIAGNOSIS — Z8619 Personal history of other infectious and parasitic diseases: Secondary | ICD-10-CM

## 2018-03-29 MED ORDER — PREDNISONE 20 MG PO TABS: ORAL_TABLET | ORAL | 0 refills | Status: DC

## 2018-03-29 NOTE — Discharge Instructions (Signed)
°  You may take 500mg  acetaminophen every 4-6 hours or in combination with ibuprofen 400-600mg  every 6-8 hours as needed for pain, inflammation, and fever.  Be sure to drink at least eight 8oz glasses of water to stay well hydrated and get at least 8 hours of sleep at night, preferably more while sick.   Please follow up with your family doctor in 1-2 weeks if not improving, sooner if worsening.

## 2018-03-29 NOTE — ED Provider Notes (Signed)
[facility]    CSN: [id number] Arrival date & time: 03/29/18  1146     History   Chief Complaint Chief Complaint  Patient presents with  . Sore Throat    HPI Seth Torres is a 22 y.o. male.   HPI  Seth SnooksBrandan Hottle is a 22 y.o. male presenting to UC with c/o 5-6 days of sore throat and low grade fever that started today. He has noticed redness, swelling and white patches in the back of his throat, c/w the 4 other times he has had mono over the last 5 months. Pt is concerned it seems to keep coming back.  He has tested negative for strep throat in the past and tested positive for mono.  He has taken Airborne but not Tylenol or Motrin.  He notes the last episode he had over these 5 months, he took Airborne and was better within 3-4 days. He is concerned it has not improved yet.  Throat pain is mild. Associated faituge. Denies n/v/d. No difficulty breathing.      Past Medical History:  Diagnosis Date  . Ganglion cyst of dorsum of right wrist     Patient Active Problem List   Diagnosis Date Noted  . Acute tonsillitis due to infectious mononucleosis 01/06/2018  . Cardiac murmur 01/02/2018  . Closed nondisplaced avulsion fracture of left talus 03/07/2017  . Ganglion cyst of dorsum of right wrist 11/29/2016    History reviewed. No pertinent surgical history.     Home Medications    Prior to Admission medications   Medication Sig Start Date End Date Taking? Authorizing Provider  predniSONE (DELTASONE) 20 MG tablet 3 tabs po daily x 3 days, then 2 tabs x 3 days, then 1.5 tabs x 3 days, then 1 tab x 3 days, then 0.5 tabs x 3 days 03/29/18   Lurene ShadowPhelps, Orabelle Rylee O, PA-C    Family History No family history on file.  Social History Social History   Tobacco Use  . Smoking status: Current Every Day Smoker    Types: E-cigarettes  . Smokeless tobacco: Never Used  Substance Use Topics  . Alcohol use: Yes  . Drug use: No     Allergies   Patient has no known  allergies.   Review of Systems Review of Systems  Constitutional: Positive for fatigue. Negative for chills and fever.  HENT: Positive for sore throat. Negative for congestion, ear pain, trouble swallowing and voice change.   Respiratory: Negative for cough and shortness of breath.   Cardiovascular: Negative for chest pain and palpitations.  Gastrointestinal: Negative for abdominal pain, diarrhea, nausea and vomiting.  Musculoskeletal: Negative for arthralgias, back pain and myalgias.  Skin: Negative for rash.     Physical Exam Triage Vital Signs ED Triage Vitals  Enc Vitals Group     BP 03/29/18 1207 (!) 147/78     Pulse Rate 03/29/18 1207 84     Resp --      Temp 03/29/18 1207 (!) 100.9 F (38.3 C)     Temp Source 03/29/18 1207 Oral     SpO2 03/29/18 1207 98 %     Weight 03/29/18 1208 157 lb 8 oz (71.4 kg)     Height 03/29/18 1208 5\' 8"  (1.727 m)     Head Circumference --      Peak Flow --      Pain Score 03/29/18 1208 5     Pain Loc --      Pain Edu? --  Excl. in GC? --    No data found.  Updated Vital Signs BP (!) 147/78 (BP Location: Right Arm)   Pulse 84   Temp (!) 100.9 F (38.3 C) (Oral)   Ht 5\' 8"  (1.727 m)   Wt 157 lb 8 oz (71.4 kg)   SpO2 98%   BMI 23.95 kg/m   Visual Acuity Right Eye Distance:   Left Eye Distance:   Bilateral Distance:    Right Eye Near:   Left Eye Near:    Bilateral Near:     Physical Exam  Constitutional: He is oriented to person, place, and time. He appears well-developed and well-nourished.  Non-toxic appearance. He does not appear ill. No distress.  HENT:  Head: Normocephalic and atraumatic.  Right Ear: Tympanic membrane normal.  Left Ear: Tympanic membrane normal.  Nose: Nose normal.  Mouth/Throat: Uvula is midline and mucous membranes are normal. Oropharyngeal exudate, posterior oropharyngeal edema and posterior oropharyngeal erythema present. No tonsillar abscesses.  Eyes: EOM are normal.  Neck: Normal range of  motion. Neck supple. No thyromegaly present.  Cardiovascular: Normal rate and regular rhythm.  Pulmonary/Chest: Effort normal and breath sounds normal. He has no wheezes. He has no rhonchi.  Musculoskeletal: Normal range of motion.  Lymphadenopathy:    He has no cervical adenopathy.  Neurological: He is alert and oriented to person, place, and time.  Skin: Skin is warm and dry.  Psychiatric: He has a normal mood and affect. His behavior is normal.  Nursing note and vitals reviewed.    UC Treatments / Results  Labs (all labs ordered are listed, but only abnormal results are displayed) Labs Reviewed - No data to display  EKG None  Radiology No results found.  Procedures Procedures (including critical care time)  Medications Ordered in UC Medications - No data to display  Initial Impression / Assessment and Plan / UC Course  I have reviewed the triage vital signs and the nursing notes.  Pertinent labs & imaging results that were available during my care of the patient were reviewed by me and considered in my medical decision making (see chart for details).     Pt declined a strep test today as he notes they have been negative in the past for the same symptoms. He has not tried prednisone yet for his mono and is willing to try it.  Final Clinical Impressions(s) / UC Diagnoses   Final diagnoses:  Exudative tonsillitis  History of mononucleosis     Discharge Instructions      You may take 500mg  acetaminophen every 4-6 hours or in combination with ibuprofen 400-600mg  every 6-8 hours as needed for pain, inflammation, and fever.  Be sure to drink at least eight 8oz glasses of water to stay well hydrated and get at least 8 hours of sleep at night, preferably more while sick.   Please follow up with your family doctor in 1-2 weeks if not improving, sooner if worsening.     ED Prescriptions    Medication Sig Dispense Auth. Provider   predniSONE (DELTASONE) 20 MG  tablet 3 tabs po daily x 3 days, then 2 tabs x 3 days, then 1.5 tabs x 3 days, then 1 tab x 3 days, then 0.5 tabs x 3 days 27 tablet Lurene Shadow, PA-C     Controlled Substance Prescriptions [place] Controlled Substance Registry consulted? Not Applicable   Rolla Plate 03/29/18 1534

## 2018-03-29 NOTE — ED Triage Notes (Signed)
Patient c/o sore throat, with a hx of mono on and off x 5 months, low grade fever today, mono keeps reoccurring, taking Airborne, no Tylenol or Ibuprofen.

## 2018-05-04 ENCOUNTER — Other Ambulatory Visit: Payer: Self-pay

## 2018-05-04 ENCOUNTER — Encounter: Payer: Self-pay | Admitting: Emergency Medicine

## 2018-05-04 ENCOUNTER — Emergency Department (INDEPENDENT_AMBULATORY_CARE_PROVIDER_SITE_OTHER)
Admission: EM | Admit: 2018-05-04 | Discharge: 2018-05-04 | Disposition: A | Discharge status: Home / Self Care | Payer: Managed Care, Other (non HMO) | Source: Home / Self Care | Attending: Family Medicine | Admitting: Family Medicine

## 2018-05-04 DIAGNOSIS — J029 Acute pharyngitis, unspecified: Secondary | ICD-10-CM

## 2018-05-04 HISTORY — DX: Infectious mononucleosis, unspecified without complication: B27.90

## 2018-05-04 MED ORDER — ACETAMINOPHEN 325 MG PO TABS
975.0000 mg | ORAL_TABLET | Freq: Once | ORAL | Status: AC
  Administered 2018-05-04: 975 mg via ORAL

## 2018-05-04 MED ORDER — PREDNISONE 20 MG PO TABS: ORAL_TABLET | ORAL | 0 refills | Status: DC

## 2018-05-04 NOTE — Discharge Instructions (Addendum)
Try warm salt water gargles for sore throat.  May take Tylenol if needed for pain. 

## 2018-05-04 NOTE — ED Provider Notes (Signed)
[facility]    CSN: [id number] Arrival date & time: 05/04/18  1149     History   Chief Complaint Chief Complaint  Patient presents with  . Generalized Body Aches  . Sore Throat    HPI Seth Torres is a 22 y.o. male.   Patient states that he is having a "mono flare-up"  Yesterday he developed sore throat, low grade fever, fatigue, myalgias, headache, and loose stools.  He denies nasal congestion and cough.   He states that he had mono 6 months ago and and has been having monthly recurrent symptoms.  The history is provided by the patient.    Past Medical History:  Diagnosis Date  . Ganglion cyst of dorsum of right wrist   . Mononucleosis     Patient Active Problem List   Diagnosis Date Noted  . Acute tonsillitis due to infectious mononucleosis 01/06/2018  . Cardiac murmur 01/02/2018  . Closed nondisplaced avulsion fracture of left talus 03/07/2017  . Ganglion cyst of dorsum of right wrist 11/29/2016    History reviewed. No pertinent surgical history.     Home Medications    Prior to Admission medications   Medication Sig Start Date End Date Taking? Authorizing Provider  predniSONE (DELTASONE) 20 MG tablet Take one tab by mouth twice daily for 4 days, then one daily for 3 days. Take with food. 05/04/18   Lattie Haw, MD    Family History No family history on file.  Social History Social History   Tobacco Use  . Smoking status: Current Every Day Smoker    Types: E-cigarettes  . Smokeless tobacco: Never Used  Substance Use Topics  . Alcohol use: Yes  . Drug use: No     Allergies   Patient has no known allergies.   Review of Systems Review of Systems + sore throat No cough No pleuritic pain No wheezing No nasal congestion ? post-nasal drainage No sinus pain/pressure No itchy/red eyes No earache No hemoptysis No SOB + fever, + chills No nausea No vomiting No abdominal pain + loose stools No urinary symptoms No skin  rash + fatigue + myalgias + headache   Physical Exam Triage Vital Signs ED Triage Vitals  Enc Vitals Group     BP 05/04/18 1252 111/67     Pulse Rate 05/04/18 1252 87     Resp 05/04/18 1252 18     Temp 05/04/18 1252 99.1 F (37.3 C)     Temp Source 05/04/18 1252 Oral     SpO2 05/04/18 1252 99 %     Weight 05/04/18 1253 160 lb (72.6 kg)     Height 05/04/18 1253 5\' 11"  (1.803 m)     Head Circumference --      Peak Flow --      Pain Score 05/04/18 1253 5     Pain Loc --      Pain Edu? --      Excl. in GC? --    No data found.  Updated Vital Signs BP 111/67 (BP Location: Right Arm)   Pulse 87   Temp 99.1 F (37.3 C) (Oral)   Resp 18   Ht 5\' 11"  (1.803 m)   Wt 160 lb (72.6 kg)   SpO2 99%   BMI 22.32 kg/m   Visual Acuity Right Eye Distance:   Left Eye Distance:   Bilateral Distance:    Right Eye Near:   Left Eye Near:    Bilateral Near:  Physical Exam Nursing notes and Vital Signs reviewed. Appearance:  Patient appears stated age, and in no acute distress Eyes:  Pupils are equal, round, and reactive to light and accomodation.  Extraocular movement is intact.  Conjunctivae are not inflamed  Ears:  Canals normal.  Tympanic membranes normal.  Nose:  Normal turbinates.  No sinus tenderness.   Pharynx:  Mildly erythematous Neck:  Supple.   Tender prominent tonsillar and lateral nodes.   Lungs:  Clear to auscultation.  Breath sounds are equal.  Moving air well. Heart:  Regular rate and rhythm without murmurs, rubs, or gallops.  Abdomen:  Nontender without masses or hepatosplenomegaly.  Bowel sounds are present.  No CVA or flank tenderness.  Extremities:  No edema.  Skin:  No rash present.    UC Treatments / Results  Labs (all labs ordered are listed, but only abnormal results are displayed) Labs Reviewed  STREP A DNA PROBE    EKG None  Radiology No results found.  Procedures Procedures (including critical care time)  Medications Ordered in  UC Medications  acetaminophen (TYLENOL) tablet 975 mg (has no administration in time range)    Initial Impression / Assessment and Plan / UC Course  I have reviewed the triage vital signs and the nursing notes.  Pertinent labs & imaging results that were available during my care of the patient were reviewed by me and considered in my medical decision making (see chart for details).    Throat culture pending. Begin prednisone burst/taper. Followup with Family Doctor if not improved in about 10 days.   Final Clinical Impressions(s) / UC Diagnoses   Final diagnoses:  Pharyngitis, unspecified etiology     Discharge Instructions     Try warm salt water gargles for sore throat.  May take Tylenol if needed for pain.    ED Prescriptions    Medication Sig Dispense Auth. Provider   predniSONE (DELTASONE) 20 MG tablet Take one tab by mouth twice daily for 4 days, then one daily for 3 days. Take with food. 11 tablet Lattie HawBeese, Ahlam Piscitelli A, MD        Lattie HawBeese, Ceil Roderick A, MD 05/07/18 [phone]

## 2018-05-04 NOTE — ED Triage Notes (Signed)
Patient here stating he is having a mono flare; since being diagnosed 6 months ago he has had a monthly flare of symptoms; yesterday he began the chills, aches, sore throat; today low grade fever; no OTCs.

## 2018-05-05 ENCOUNTER — Telehealth: Payer: Self-pay

## 2018-05-05 LAB — STREP A DNA PROBE: Group A Strep Probe: NOT DETECTED

## 2018-05-05 NOTE — Telephone Encounter (Signed)
Not feeling better.  Has an appointment to follow up with PCP tomorrow.

## 2018-05-06 ENCOUNTER — Ambulatory Visit (INDEPENDENT_AMBULATORY_CARE_PROVIDER_SITE_OTHER): Payer: Managed Care, Other (non HMO)

## 2018-05-06 ENCOUNTER — Encounter: Payer: Self-pay | Admitting: Physician Assistant

## 2018-05-06 ENCOUNTER — Ambulatory Visit (INDEPENDENT_AMBULATORY_CARE_PROVIDER_SITE_OTHER): Payer: Managed Care, Other (non HMO) | Admitting: Physician Assistant

## 2018-05-06 VITALS — BP 148/85 | HR 102 | Temp 101.8°F | Resp 16 | Wt 160.0 lb

## 2018-05-06 DIAGNOSIS — R509 Fever, unspecified: Secondary | ICD-10-CM | POA: Diagnosis not present

## 2018-05-06 DIAGNOSIS — J029 Acute pharyngitis, unspecified: Secondary | ICD-10-CM

## 2018-05-06 DIAGNOSIS — J989 Respiratory disorder, unspecified: Secondary | ICD-10-CM

## 2018-05-06 DIAGNOSIS — H6123 Impacted cerumen, bilateral: Secondary | ICD-10-CM | POA: Diagnosis not present

## 2018-05-06 DIAGNOSIS — J039 Acute tonsillitis, unspecified: Secondary | ICD-10-CM

## 2018-05-06 DIAGNOSIS — R110 Nausea: Secondary | ICD-10-CM

## 2018-05-06 DIAGNOSIS — B2790 Infectious mononucleosis, unspecified without complication: Secondary | ICD-10-CM

## 2018-05-06 LAB — POCT URINALYSIS DIPSTICK
Bilirubin, UA: NEGATIVE
Glucose, UA: NEGATIVE
Ketones, UA: NEGATIVE
Leukocytes, UA: NEGATIVE
Nitrite, UA: NEGATIVE
PH UA: 7 (ref 5.0–8.0)
PROTEIN UA: NEGATIVE
RBC UA: NEGATIVE
SPEC GRAV UA: 1.01 (ref 1.010–1.025)
Urobilinogen, UA: 0.2 E.U./dL

## 2018-05-06 MED ORDER — MELOXICAM 7.5 MG PO TABS: 7.5000 mg | ORAL_TABLET | Freq: Every day | ORAL | 0 refills | Status: DC | PRN

## 2018-05-06 MED ORDER — TRAMADOL HCL 50 MG PO TABS: 50.0000 mg | ORAL_TABLET | Freq: Every evening | ORAL | 0 refills | Status: DC | PRN

## 2018-05-06 MED ORDER — ONDANSETRON 4 MG PO TBDP: 4.0000 mg | ORAL_TABLET | Freq: Four times a day (QID) | ORAL | 0 refills | Status: DC | PRN

## 2018-05-06 MED ORDER — ONDANSETRON 4 MG PO TBDP
4.0000 mg | ORAL_TABLET | Freq: Once | ORAL | Status: AC
Start: 2018-05-06 — End: 2018-05-06
  Administered 2018-05-06: 4 mg via ORAL

## 2018-05-06 MED ORDER — KETOROLAC TROMETHAMINE 30 MG/ML IJ SOLN
30.0000 mg | Freq: Once | INTRAMUSCULAR | Status: AC
  Administered 2018-05-06: 30 mg via INTRAMUSCULAR

## 2018-05-06 NOTE — Progress Notes (Signed)
HPI:                                                                Seth Torres is a 22 y.o. male who [facility] today for sore throat and fever  Patient presents with sore throat, non-productive cough and fever up to 102 beginning 3 days ago. Associated with nausea and back pain. Denies chest pain, dyspnea, abdominal pain, vomiting, change in bowel habits. He was diagnosed with EBV on 01/02/18. Reports symptoms have been waxing and waning since then where he will be asymptomatic for 4-6 weeks and then sore throat and fever will recur. He presented to urgent care on 05/04/18 and was prescribed Prednisone. He never started this medication. Not currently taking any fever reducers.  No flowsheet data found.  No flowsheet data found.    Past Medical History:  Diagnosis Date  . Ganglion cyst of dorsum of right wrist   . Mononucleosis    History reviewed. No pertinent surgical history. Social History   Tobacco Use  . Smoking status: Current Every Day Smoker    Types: E-cigarettes  . Smokeless tobacco: Never Used  Substance Use Topics  . Alcohol use: Yes   family history is not on file.    ROS: negative except as noted in the HPI  Medications: Current Outpatient Medications  Medication Sig Dispense Refill  . meloxicam (MOBIC) 7.5 MG tablet Take 1 tablet (7.5 mg total) by mouth daily as needed for pain (fever). 30 tablet 0  . ondansetron (ZOFRAN-ODT) 4 MG disintegrating tablet Take 1 tablet (4 mg total) by mouth every 6 (six) hours as needed for nausea or vomiting. 16 tablet 0  . traMADol (ULTRAM) 50 MG tablet Take 1 tablet (50 mg total) by mouth at bedtime as needed for severe pain. 1-2 tabs by mouth Q8 hours, maximum 6 tabs per day. 20 tablet 0   No current facility-administered medications for this visit.    No Known Allergies     Objective:  BP (!) 148/85   Pulse (!) 102   Temp (!) 101.8 F (38.8 C)    Resp 16   Wt 160 lb (72.6 kg)   BMI 22.32 kg/m  Gen:  alert, ill-appearing, not toxic-appearing, no distress, appropriate for age HEENT: head normocephalic without obvious abnormality, conjunctiva and cornea clear, bilateral cerumen impactions, unable to visualize TM's, oropharynx with erythema and tonsillar exudates, uvula midline, neck supple, trachea midline Pulm: Normal work of breathing, normal phonation, clear to auscultation bilaterally, no wheezes, rales or rhonchi CV: mild tachycardia, regular rhythm, s1 and s2 distinct, no murmurs, clicks or rubs  GI: abdomen soft, non-tender, no hepatosplenomegaly Neuro: alert and oriented x 3, no tremor MSK: extremities atraumatic, normal gait and station Skin: warm, diaphoretic, no rashes    No results found for this or any previous visit (from the past 72 hour(s)). No results found.    Assessment and Plan: 22 y.o. male with   Febrile respiratory illness - Plan: DG Chest 2 View, POCT Urinalysis Dipstick, ondansetron (ZOFRAN-ODT) disintegrating tablet 4 mg, ketorolac (TORADOL) 30 MG/ML injection 30 mg  Tonsillopharyngitis - Plan: meloxicam (MOBIC) 7.5 MG tablet, traMADol (ULTRAM) 50 MG tablet, ondansetron (ZOFRAN-ODT) disintegrating tablet 4 mg, ketorolac (TORADOL)  30 MG/ML injection 30 mg  Nausea without vomiting - Plan: ondansetron (ZOFRAN-ODT) 4 MG disintegrating tablet, ondansetron (ZOFRAN-ODT) disintegrating tablet 4 mg, ketorolac (TORADOL) 30 MG/ML injection 30 mg  Bilateral impacted cerumen  Epstein Barr virus infection   Febrile at 101.8, tachycardic at 102, mildly hypertensive Personally reviewed CXR, no evidence of infiltrate or effusion. UA negative, Strep A DNA probe 05/04/18 negative Work-up 01/02/18 showed recent EBV infection with elevated IgG and mild leukocytosis (11.0), negative STI testing including HIV With continued fever and tonsillar exudates, symptoms are most likely still due to infectious mono. Explained  symptoms can persist for up to 6 months. Reassurance provided. IM Toradol 30 mg and Zofran 4 mg given in office today Supportive care with Meloxicam prn for fever/pain and Tramadol prn for pain/swelling Zofran prn for nausea/vomiting  Patient education and anticipatory guidance given Patient agrees with treatment plan Follow-up as needed if symptoms worsen or fail to improve  Levonne Hubert PA-C

## 2018-05-18 ENCOUNTER — Encounter: Payer: Self-pay | Admitting: Physician Assistant

## 2018-05-18 DIAGNOSIS — H6123 Impacted cerumen, bilateral: Secondary | ICD-10-CM | POA: Insufficient documentation

## 2018-05-18 DIAGNOSIS — B2790 Infectious mononucleosis, unspecified without complication: Secondary | ICD-10-CM | POA: Insufficient documentation

## 2018-05-19 ENCOUNTER — Ambulatory Visit: Payer: Managed Care, Other (non HMO) | Admitting: Physician Assistant

## 2018-08-15 IMAGING — DX DG ANKLE COMPLETE 3+V*L*
3 series · 3 of 3 positions shown · non-contrast
Comparison: 03/07/2017

CLINICAL DATA: Twisting injury while running yesterday with pain,
initial encounter

EXAM:
LEFT ANKLE COMPLETE - 3+ VIEW

[ankle ap]
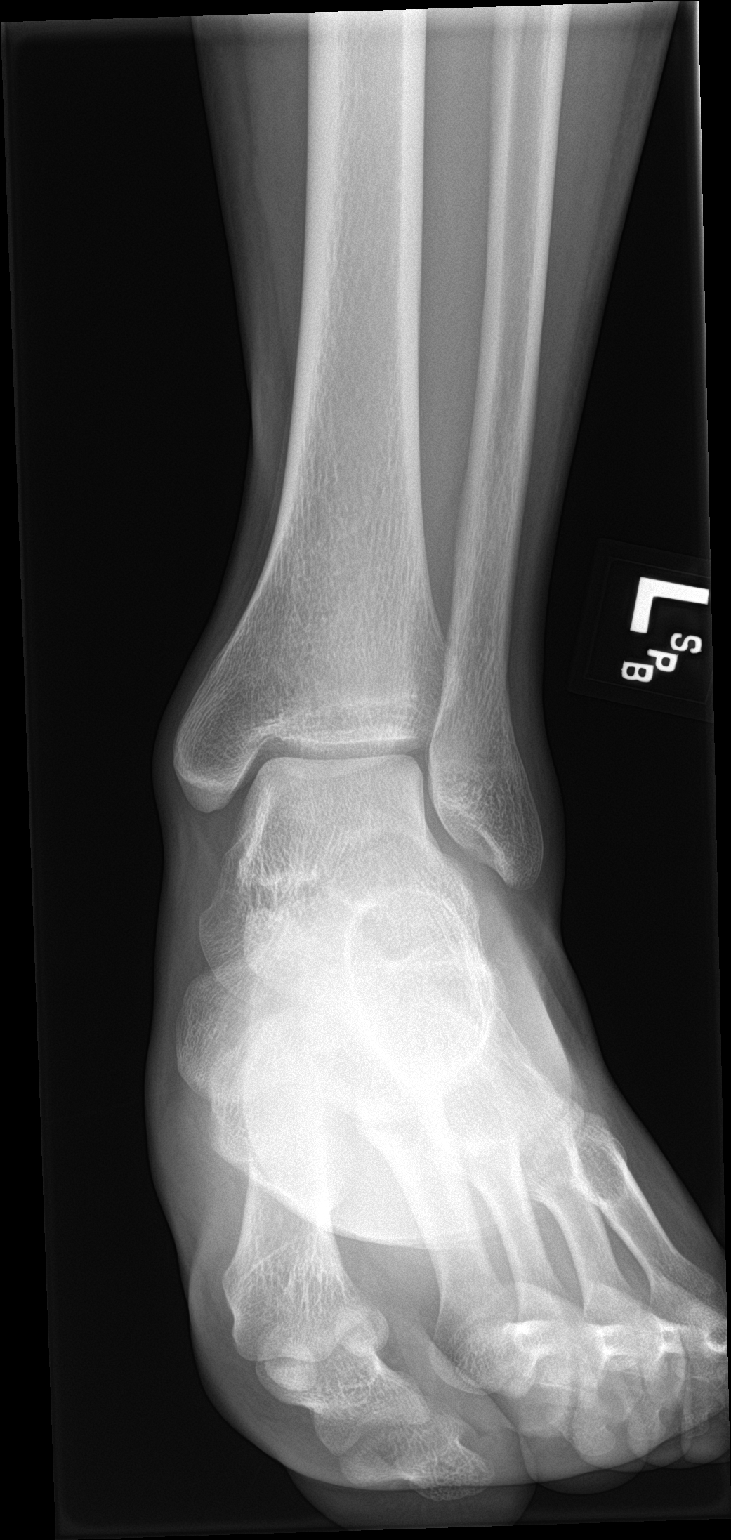

[ankle obl]
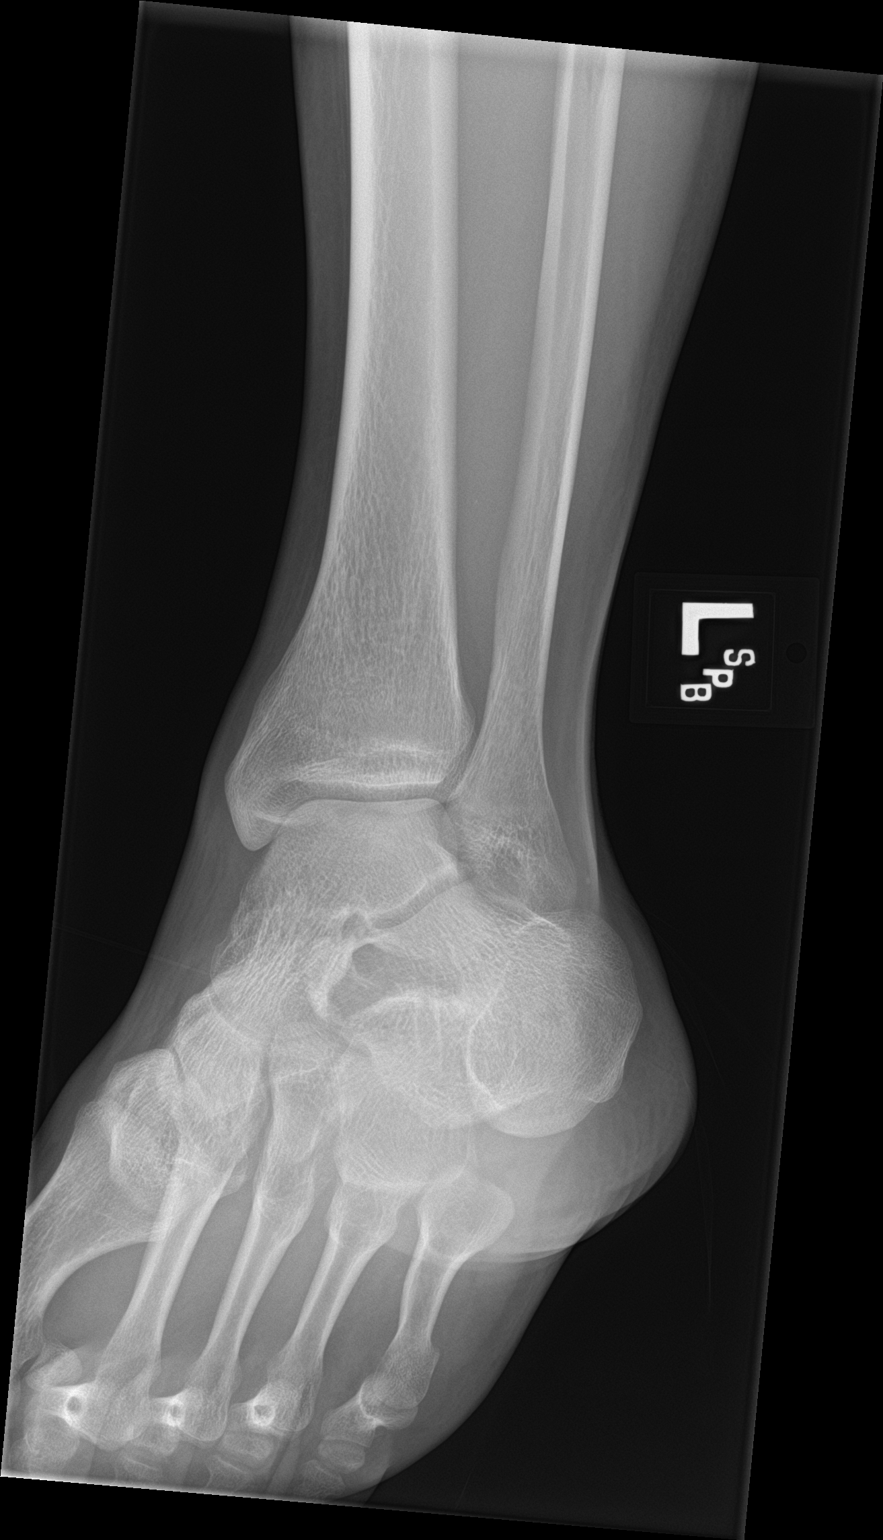

[ankle lat]
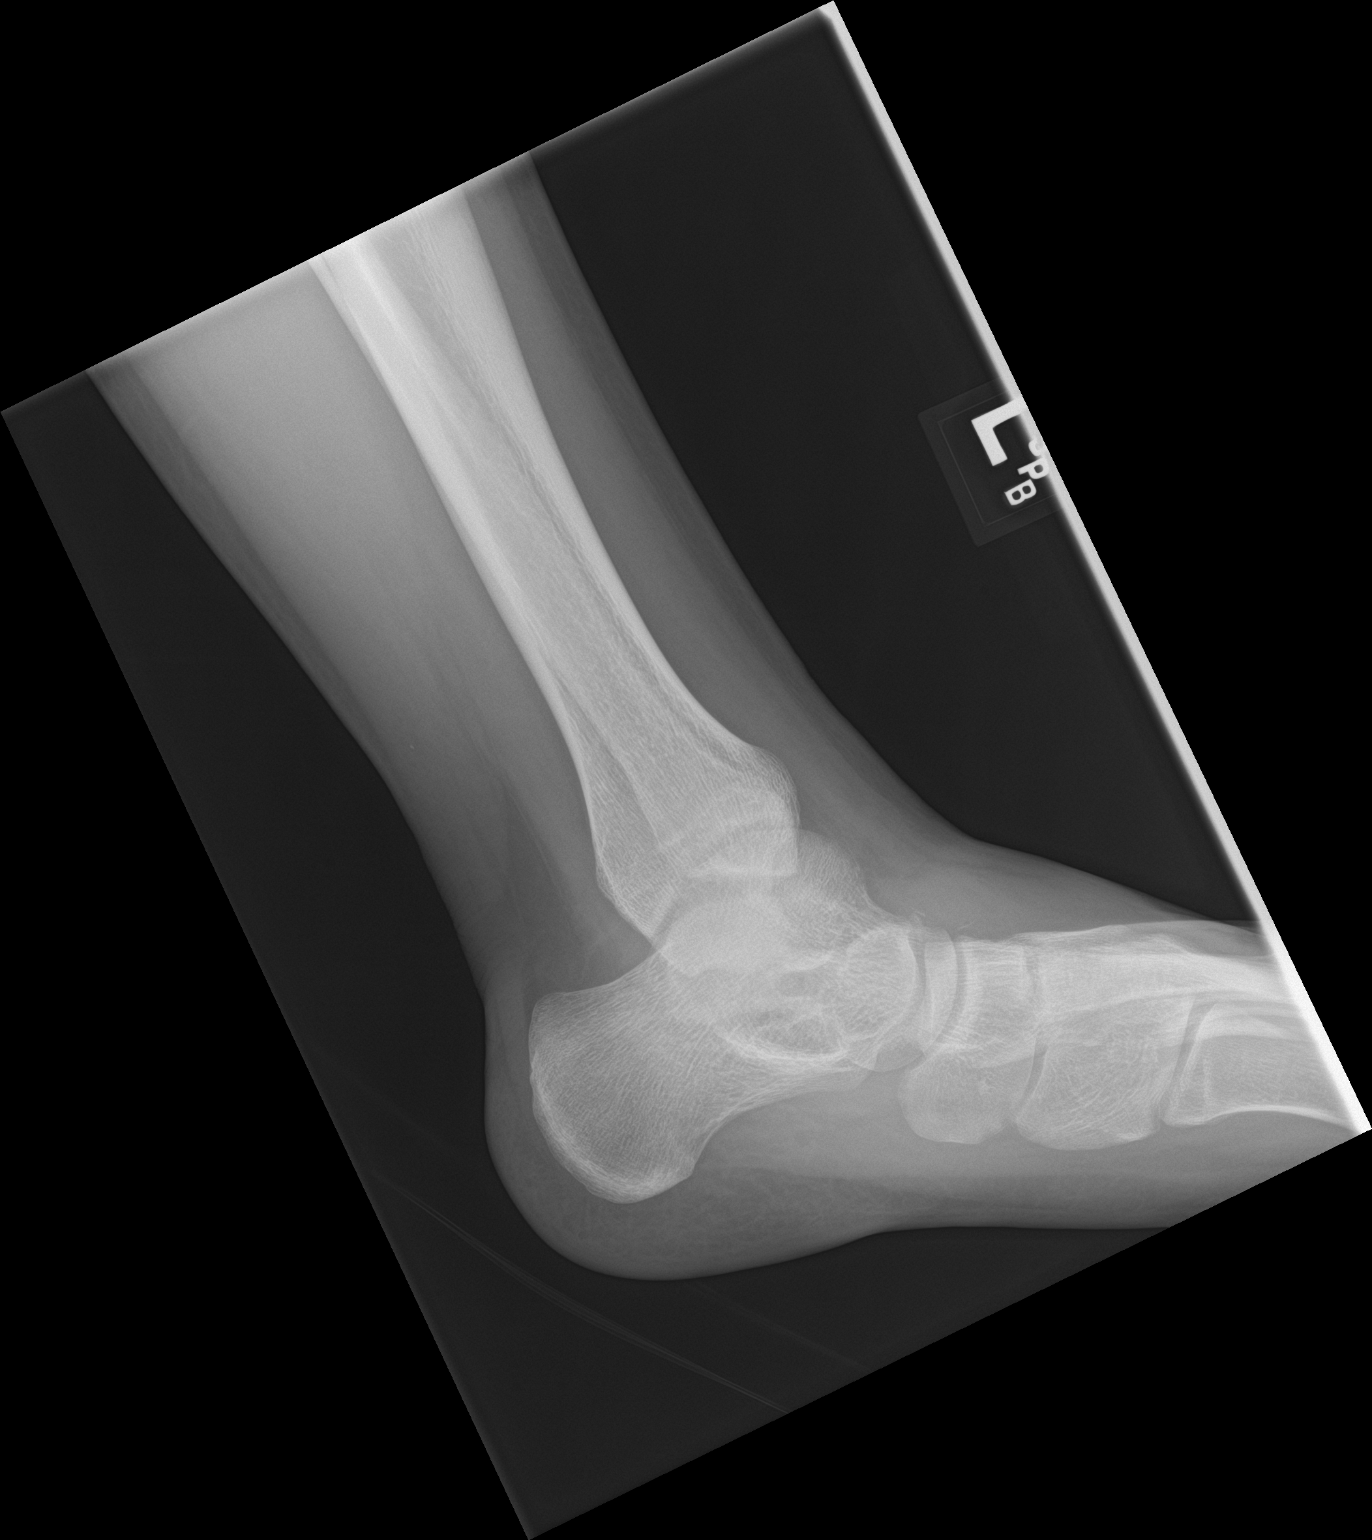

[3 of 3 positions shown; findings below may reference images not displayed]

FINDINGS: Small avulsions are again identified at the dorsal aspect of the
talus stable from the previous exam. Mild soft tissue swelling is
noted in this region as well. No other bony abnormality is seen.
IMPRESSION: Small avulsions from the distal talus as described. Soft tissue
swelling is noted as well.

## 2018-09-18 ENCOUNTER — Encounter: Payer: Self-pay | Admitting: Family Medicine

## 2018-09-18 ENCOUNTER — Other Ambulatory Visit: Payer: Self-pay

## 2018-09-18 ENCOUNTER — Emergency Department (INDEPENDENT_AMBULATORY_CARE_PROVIDER_SITE_OTHER)
Admission: EM | Admit: 2018-09-18 | Discharge: 2018-09-18 | Disposition: A | Discharge status: Home / Self Care | Payer: Managed Care, Other (non HMO) | Source: Home / Self Care

## 2018-09-18 DIAGNOSIS — J40 Bronchitis, not specified as acute or chronic: Secondary | ICD-10-CM

## 2018-09-18 MED ORDER — PREDNISONE 20 MG PO TABS: ORAL_TABLET | ORAL | 1 refills | Status: DC

## 2018-09-18 MED ORDER — AZITHROMYCIN 250 MG PO TABS: ORAL_TABLET | ORAL | 0 refills | Status: DC

## 2018-09-18 NOTE — ED Provider Notes (Signed)
[facility]    CSN: [id number] Arrival date & time: 09/18/18  1347     History   Chief Complaint Chief Complaint  Patient presents with  . Cough    HPI Seth Torres is a 22 y.o. male.   [facility] comes in today for evaluation of cough.  Patient works part-time for [employer].  He smokes.  He is never had asthma.  His cough is productive some of the time and he has had a for 2 weeks.  It is keeping his girlfriend awake at night.  He has no shortness of breath or chest pain.  He is not aware of any fever but he has had chills at night.     Past Medical History:  Diagnosis Date  . Ganglion cyst of dorsum of right wrist   . Mononucleosis     Patient Active Problem List   Diagnosis Date Noted  . Bilateral impacted cerumen 05/18/2018  . Malachi CarlEpstein Barr virus infection 05/18/2018  . Acute tonsillitis due to infectious mononucleosis 01/06/2018  . Cardiac murmur 01/02/2018  . Closed nondisplaced avulsion fracture of left talus 03/07/2017  . Ganglion cyst of dorsum of right wrist 11/29/2016    History reviewed. No pertinent surgical history.     Home Medications    Prior to Admission medications   Medication Sig Start Date End Date Taking? Authorizing Provider  guaiFENesin (MUCINEX) 600 MG 12 hr tablet Take by mouth 2 (two) times daily.   Yes [provider]  naproxen sodium (ALEVE) 220 MG tablet Take 220 mg by mouth.   Yes [provider]  azithromycin (ZITHROMAX) 250 MG tablet Take 2 tabs PO x 1 dose, then 1 tab PO QD x 4 days 09/18/18   Elvina SidleLauenstein, Ladarrion Telfair, MD  predniSONE (DELTASONE) 20 MG tablet 2 daily with food 09/18/18   Elvina SidleLauenstein, Ottis Vacha, MD    Family History No family history on file.  Social History Social History   Tobacco Use  . Smoking status: Current Every Day Smoker    Types: E-cigarettes  . Smokeless tobacco: Never Used  Substance Use Topics  . Alcohol use: Yes  . Drug use: No     Allergies   Patient has  no known allergies.   Review of Systems Review of Systems  Constitutional: Positive for chills.  Respiratory: Positive for cough and wheezing.      Physical Exam Triage Vital Signs ED Triage Vitals  Enc Vitals Group     BP      Pulse      Resp      Temp      Temp src      SpO2      Weight      Height      Head Circumference      Peak Flow      Pain Score      Pain Loc      Pain Edu?      Excl. in GC?    No data found.  Updated Vital Signs BP (!) 161/85 (BP Location: Left Arm)   Pulse (!) 102   Temp 98 F (36.7 C) (Oral)   Ht 5\' 8"  (1.727 m)   Wt 72.6 kg   SpO2 97%   BMI 24.33 kg/m    Physical Exam  Constitutional: He is oriented to person, place, and time. He appears well-developed and well-nourished.  HENT:  Right Ear: External ear normal.  Left Ear: External ear normal.  Mouth/Throat: Oropharynx is clear and moist.  Eyes: Pupils are equal, round, and reactive to light. Conjunctivae are normal.  Neck: Normal range of motion. Neck supple.  Cardiovascular: Normal rate, regular rhythm and normal heart sounds.  Pulmonary/Chest: Effort normal.  Few expiratory wheezes which clear with cough, few fine rales at the right base  Abdominal: Soft.  Musculoskeletal: Normal range of motion.  Neurological: He is alert and oriented to person, place, and time.  Skin: Skin is warm and dry.  Nursing note and vitals reviewed.    UC Treatments / Results  Labs (all labs ordered are listed, but only abnormal results are displayed) Labs Reviewed - No data to display  EKG None  Radiology No results found.  Procedures Procedures (including critical care time)  Medications Ordered in UC Medications - No data to display  Initial Impression / Assessment and Plan / UC Course  I have reviewed the triage vital signs and the nursing notes.  Pertinent labs & imaging results that were available during my care of the patient were reviewed by me and considered in my  medical decision making (see chart for details).    Final Clinical Impressions(s) / UC Diagnoses   Final diagnoses:  Bronchitis     Discharge Instructions     He should definitely be feeling much better in the next 24 hours.  If your cough persists and you are still having difficulty by Sunday, please return    ED Prescriptions    Medication Sig Dispense Auth. Provider   predniSONE (DELTASONE) 20 MG tablet 2 daily with food 10 tablet Elvina Sidle, MD   azithromycin (ZITHROMAX) 250 MG tablet Take 2 tabs PO x 1 dose, then 1 tab PO QD x 4 days 6 tablet Elvina Sidle, MD     Controlled Substance Prescriptions  Controlled Substance Registry consulted? Not Applicable   Elvina Sidle, MD 09/18/18 1408

## 2018-09-18 NOTE — ED Notes (Signed)
Pt reports having a productive cough x1 week. States it is worse at night. Pt is coughing up some pleghm, it is yellow/green in color. Pt is a current smoker. Reports taking OTC Mucinex and Aleve. Denies any fever. Denies flu shot.

## 2018-09-18 NOTE — Discharge Instructions (Addendum)
He should definitely be feeling much better in the next 24 hours.  If your cough persists and you are still having difficulty by Sunday, please return

## 2018-09-21 ENCOUNTER — Emergency Department (INDEPENDENT_AMBULATORY_CARE_PROVIDER_SITE_OTHER): Payer: Managed Care, Other (non HMO)

## 2018-09-21 ENCOUNTER — Other Ambulatory Visit: Payer: Self-pay

## 2018-09-21 ENCOUNTER — Emergency Department (INDEPENDENT_AMBULATORY_CARE_PROVIDER_SITE_OTHER)
Admission: EM | Admit: 2018-09-21 | Discharge: 2018-09-21 | Disposition: A | Discharge status: Home / Self Care | Payer: Managed Care, Other (non HMO) | Source: Home / Self Care | Attending: Family Medicine | Admitting: Family Medicine

## 2018-09-21 DIAGNOSIS — R05 Cough: Secondary | ICD-10-CM

## 2018-09-21 DIAGNOSIS — J101 Influenza due to other identified influenza virus with other respiratory manifestations: Secondary | ICD-10-CM | POA: Diagnosis not present

## 2018-09-21 DIAGNOSIS — J40 Bronchitis, not specified as acute or chronic: Secondary | ICD-10-CM

## 2018-09-21 DIAGNOSIS — F17210 Nicotine dependence, cigarettes, uncomplicated: Secondary | ICD-10-CM | POA: Diagnosis not present

## 2018-09-21 DIAGNOSIS — R509 Fever, unspecified: Secondary | ICD-10-CM

## 2018-09-21 LAB — POCT INFLUENZA A/B
INFLUENZA A, POC: NEGATIVE
Influenza B, POC: POSITIVE — AB

## 2018-09-21 MED ORDER — GUAIFENESIN-CODEINE 100-10 MG/5ML PO SOLN: ORAL | 0 refills | Status: DC

## 2018-09-21 MED ORDER — OSELTAMIVIR PHOSPHATE 75 MG PO CAPS: 75.0000 mg | ORAL_CAPSULE | Freq: Two times a day (BID) | ORAL | 0 refills | Status: DC

## 2018-09-21 NOTE — ED Provider Notes (Signed)
[facility]    CSN: [id number] Arrival date & time: 09/21/18  1128     History   Chief Complaint Chief Complaint  Patient presents with  . Cough    Follow up    HPI Seth Torres is a 22 y.o. male.   Patient was treated for URI three days ago.  He feels no better after beginning a Z-pak and prednisone.  He has had sinus congestion for about a week.  Two days ago he suddenly became increasingly fatigued with onset of myalgias, fever to 100.2, and increasing shortness of breath.  The history is provided by the patient.    Past Medical History:  Diagnosis Date  . Ganglion cyst of dorsum of right wrist   . Mononucleosis     Patient Active Problem List   Diagnosis Date Noted  . Bilateral impacted cerumen 05/18/2018  . Malachi CarlEpstein Barr virus infection 05/18/2018  . Acute tonsillitis due to infectious mononucleosis 01/06/2018  . Cardiac murmur 01/02/2018  . Closed nondisplaced avulsion fracture of left talus 03/07/2017  . Ganglion cyst of dorsum of right wrist 11/29/2016    History reviewed. No past surgical history.     Home Medications    Prior to Admission medications   Medication Sig Start Date End Date Taking? Authorizing Provider  azithromycin (ZITHROMAX) 250 MG tablet Take 2 tabs PO x 1 dose, then 1 tab PO QD x 4 days 09/18/18   Elvina SidleLauenstein, Kurt, MD  guaiFENesin (MUCINEX) 600 MG 12 hr tablet Take by mouth 2 (two) times daily.    [provider]  guaiFENesin-codeine 100-10 MG/5ML syrup Take 10mL by mouth at bedtime as needed for cough.  May repeat dose in 4 to 6 hours. 09/21/18   Lattie HawBeese, Pollyann Roa A, MD  naproxen sodium (ALEVE) 220 MG tablet Take 220 mg by mouth.    [provider]  oseltamivir (TAMIFLU) 75 MG capsule Take 1 capsule (75 mg total) by mouth every 12 (twelve) hours. 09/21/18   Lattie HawBeese, Wynnie Pacetti A, MD  predniSONE (DELTASONE) 20 MG tablet 2 daily with food 09/18/18   Elvina SidleLauenstein, Kurt, MD    Family  History Non-contributory.  Social History Social History   Tobacco Use  . Smoking status: Current Every Day Smoker    Types: E-cigarettes  . Smokeless tobacco: Never Used  Substance Use Topics  . Alcohol use: Yes  . Drug use: No     Allergies   Patient has no known allergies.   Review of Systems Review of Systems No sore throat + cough No pleuritic pain No wheezing + nasal congestion + post-nasal drainage No sinus pain/pressure No itchy/red eyes No earache No hemoptysis + SOB + fever, + chills No nausea No vomiting No abdominal pain No diarrhea No urinary symptoms No skin rash + fatigue + myalgias + headache Used OTC meds without relief   Physical Exam Triage Vital Signs ED Triage Vitals [09/21/18 1310]  Enc Vitals Group     BP (!) 143/84     Pulse Rate 93     Resp 16     Temp 97.7 F (36.5 C)     Temp Source Oral     SpO2 98 %     Weight 160 lb (72.6 kg)     Height 5\' 8"  (1.727 m)     Head Circumference      Peak Flow      Pain Score 0     Pain Loc      Pain Edu?  Excl. in GC?    No data found.  Updated Vital Signs BP (!) 143/84 (BP Location: Right Arm)   Pulse 93   Temp 97.7 F (36.5 C) (Oral)   Resp 16   Ht 5\' 8"  (1.727 m)   Wt 72.6 kg   SpO2 98%   BMI 24.33 kg/m   Visual Acuity Right Eye Distance:   Left Eye Distance:   Bilateral Distance:    Right Eye Near:   Left Eye Near:    Bilateral Near:     Physical Exam Nursing notes and Vital Signs reviewed. Appearance:  Patient appears stated age, and in no acute distress Eyes:  Pupils are equal, round, and reactive to light and accomodation.  Extraocular movement is intact.  Conjunctivae are not inflamed  Ears:  Canals normal.  Tympanic membranes normal.  Nose:  Mildly congested turbinates.  No sinus tenderness.   Pharynx:  Normal Neck:  Supple.  Enlarged posterior/lateral nodes are palpated bilaterally, tender to palpation on the left.   Lungs:  Clear to auscultation.   Breath sounds are equal.  Moving air well. Heart:  Regular rate and rhythm without murmurs, rubs, or gallops.  Abdomen:  Nontender without masses or hepatosplenomegaly.  Bowel sounds are present.  No CVA or flank tenderness.  Extremities:  No edema.  Skin:  No rash present.    UC Treatments / Results  Labs (all labs ordered are listed, but only abnormal results are displayed) Labs Reviewed  POCT INFLUENZA A/B - Abnormal; Notable for the following components:      Result Value   Influenza B, POC Positive (*)    All other components within normal limits    EKG None  Radiology Dg Chest 2 View  Result Date: 09/21/2018 CLINICAL DATA:  Pt c/o productive cough x 2 weeks and fever x 3 days. Smokes 1/2 pkpd. EXAM: CHEST - 2 VIEW COMPARISON:  05/06/2018 FINDINGS: Midline trachea. Normal heart size and mediastinal contours. No pleural effusion or pneumothorax. Diffuse peribronchial thickening. IMPRESSION: 1. No acute cardiopulmonary disease. 2. Mild peribronchial thickening which may relate to chronic bronchitis or smoking. Electronically Signed   By: Jeronimo Greaves M.D.   On: 09/21/2018 14:50    Procedures Procedures (including critical care time)  Medications Ordered in UC Medications - No data to display  Initial Impression / Assessment and Plan / UC Course  I have reviewed the triage vital signs and the nursing notes.  Pertinent labs & imaging results that were available during my care of the patient were reviewed by me and considered in my medical decision making (see chart for details).    Begin Tamiflu.  Rx for Robitussin AC for night time cough.  Controlled Substance Prescriptions I have consulted the [place] Controlled Substances Registry for this patient, and feel the risk/benefit ratio today is favorable for proceeding with this prescription for a controlled substance.   Followup with Family Doctor if not improved in about 5 days.   Final Clinical Impressions(s) / UC Diagnoses    Final diagnoses:  Bronchitis  Influenza B     Discharge Instructions     Finish azithromycin and prednisone. Take plain guaifenesin (1200mg  extended release tabs such as Mucinex) twice daily, with plenty of water, for cough and congestion.  May add Pseudoephedrine (30mg , one or two every 4 to 6 hours) for sinus congestion.  Get adequate rest.   May use Afrin nasal spray (or generic oxymetazoline) each morning for about 5 days and then discontinue.  Also recommend using saline nasal spray several times daily and saline nasal irrigation (AYR is a common brand).  Use Flonase nasal spray each morning after using Afrin nasal spray and saline nasal irrigation. Try warm salt water gargles for sore throat.  Stop all antihistamines for now, and other non-prescription cough/cold preparations. May take Tylenol as needed for fever, headache, etc.      ED Prescriptions    Medication Sig Dispense Auth. Provider   oseltamivir (TAMIFLU) 75 MG capsule Take 1 capsule (75 mg total) by mouth every 12 (twelve) hours. 10 capsule Lattie Haw, MD   guaiFENesin-codeine 100-10 MG/5ML syrup Take 10mL by mouth at bedtime as needed for cough.  May repeat dose in 4 to 6 hours. 100 mL Lattie Haw, MD        Lattie Haw, MD 09/23/18 1700

## 2018-09-21 NOTE — Discharge Instructions (Addendum)
Finish azithromycin and prednisone. Take plain guaifenesin (1200mg  extended release tabs such as Mucinex) twice daily, with plenty of water, for cough and congestion.  May add Pseudoephedrine (30mg , one or two every 4 to 6 hours) for sinus congestion.  Get adequate rest.   May use Afrin nasal spray (or generic oxymetazoline) each morning for about 5 days and then discontinue.  Also recommend using saline nasal spray several times daily and saline nasal irrigation (AYR is a common brand).  Use Flonase nasal spray each morning after using Afrin nasal spray and saline nasal irrigation. Try warm salt water gargles for sore throat.  Stop all antihistamines for now, and other non-prescription cough/cold preparations. May take Tylenol as needed for fever, headache, etc.

## 2018-09-21 NOTE — ED Triage Notes (Signed)
Pt was seen here in UC on 12/5. Dx with bronchitis. Given prednisone and zpak. Not feeling much improved. Here today for re-eval.

## 2018-11-09 ENCOUNTER — Emergency Department (INDEPENDENT_AMBULATORY_CARE_PROVIDER_SITE_OTHER)
Admission: EM | Admit: 2018-11-09 | Discharge: 2018-11-09 | Disposition: A | Discharge status: Home / Self Care | Payer: Managed Care, Other (non HMO) | Source: Home / Self Care | Attending: Emergency Medicine | Admitting: Emergency Medicine

## 2018-11-09 ENCOUNTER — Encounter: Payer: Self-pay | Admitting: Emergency Medicine

## 2018-11-09 DIAGNOSIS — J029 Acute pharyngitis, unspecified: Secondary | ICD-10-CM | POA: Diagnosis not present

## 2018-11-09 DIAGNOSIS — R6889 Other general symptoms and signs: Secondary | ICD-10-CM | POA: Diagnosis not present

## 2018-11-09 DIAGNOSIS — J039 Acute tonsillitis, unspecified: Secondary | ICD-10-CM

## 2018-11-09 LAB — POCT INFLUENZA A/B
Influenza A, POC: NEGATIVE
Influenza B, POC: NEGATIVE

## 2018-11-09 LAB — POCT RAPID STREP A (OFFICE): Rapid Strep A Screen: NEGATIVE

## 2018-11-09 MED ORDER — AMOXICILLIN-POT CLAVULANATE 875-125 MG PO TABS: 1.0000 | ORAL_TABLET | Freq: Two times a day (BID) | ORAL | 0 refills | Status: DC

## 2018-11-09 NOTE — ED Provider Notes (Addendum)
[facility]    CSN: [id number] Arrival date & time: 11/09/18  1520     History   Chief Complaint Chief Complaint  Patient presents with  . Sore Throat    HPI Seth Torres is a 23 y.o. male.   HPI  Chief complaint: SORE THROAT Onset: 3 days    Severity: moderate, progressing to severe, especially on the left side of back of throat. Tried OTC meds without significant relief.  Associated symptoms:   + Fever and chills and fatigue. + Swollen neck glands No known recent Strep Exposure     Positive mild myalgias Positive, mild nonfocal headache No Rash  No Discolored Nasal Mucus No Allergy symptoms No sinus pain/pressure No itchy/red eyes No earache  No Drooling No Trismus  Had mild nausea but that resolved. He has decreased appetite, but he is tolerating p.o. clear liquids. No Vomiting No Abdominal pain No Diarrhea No Reflux symptoms  No Cough No Breathing Difficulty No Shortness of Breath No pleuritic pain No Wheezing No Hemoptysis   Had mononucleosis, diagnosed March 2019. Was seen  December 5 with URI and treated with Z-Pak and prednisone. Was seen here 09/21/2018, positive test for influenza B.-And was prescribed Tamiflu.-The symptoms of influenza B resolved after 1 week and he was doing well until acute symptoms above started 2 days ago.  Past Medical History:  Diagnosis Date  . Ganglion cyst of dorsum of right wrist   . Mononucleosis     Patient Active Problem List   Diagnosis Date Noted  . Bilateral impacted cerumen 05/18/2018  . 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 Acute tonsillitis due to infectious mononucleosis 01/06/2018  . Cardiac murmur 01/02/2018  . Closed nondisplaced avulsion fracture of left talus 03/07/2017  . Ganglion cyst of dorsum of right wrist 11/29/2016    History reviewed. No pertinent surgical history.     Home Medications -none    Family History No family history on file. None noted by  patient  Social History Social History   Tobacco Use  . Smoking status: Current Every Day Smoker    Types: E-cigarettes  . Smokeless tobacco: Never Used  Substance Use Topics  . Alcohol use: Yes  . Drug use: No   Uses e-cigarettes daily  Allergies   Patient has no known allergies.   Review of Systems Review of Systems  All other systems reviewed and are negative.  Pertinent items noted in HPI and remainder of comprehensive ROS otherwise negative.   Physical Exam Triage Vital Signs ED Triage Vitals  Enc Vitals Group     BP      Pulse      Resp      Temp      Temp src      SpO2      Weight      Height      Head Circumference      Peak Flow      Pain Score      Pain Loc      Pain Edu?      Excl. in GC?    No data found.  Updated Vital Signs BP 138/78 (BP Location: Right Arm)   Pulse 85   Temp 98 F (36.7 C) (Oral)   Ht 5\' 8"  (1.727 m)   Wt 78.1 kg   SpO2 100%   BMI 26.19 kg/m    Physical Exam Vitals signs and nursing note reviewed.  Constitutional:      General:  He is not in acute distress.    Appearance: He is well-developed. He is ill-appearing. He is not toxic-appearing.  HENT:     Head: Normocephalic and atraumatic.     Right Ear: Tympanic membrane, ear canal and external ear normal.     Left Ear: Tympanic membrane, ear canal and external ear normal.     Nose: Nose normal.     Right Sinus: No maxillary sinus tenderness or frontal sinus tenderness.     Left Sinus: No maxillary sinus tenderness or frontal sinus tenderness.     Mouth/Throat:     Mouth: Mucous membranes are moist. No oral lesions.     Pharynx: Uvula midline. Oropharyngeal exudate and posterior oropharyngeal erythema present. No uvula swelling.     Tonsils: Tonsillar exudate present. No tonsillar abscesses. Swelling: 1+ on the right. 2+ on the left.     Comments: Airway intact Eyes:     General: No scleral icterus.    Conjunctiva/sclera: Conjunctivae normal.  Neck:      Musculoskeletal: Neck supple.  Cardiovascular:     Rate and Rhythm: Normal rate and regular rhythm.     Heart sounds: Normal heart sounds. No murmur.  Pulmonary:     Effort: Pulmonary effort is normal. No respiratory distress.     Breath sounds: Normal breath sounds. No stridor. No wheezing, rhonchi or rales.  Abdominal:     Palpations: Abdomen is soft. There is no mass.     Tenderness: There is no abdominal tenderness.  Lymphadenopathy:     Cervical: Cervical adenopathy present.     Right cervical: Superficial cervical adenopathy present. No deep or posterior cervical adenopathy.    Left cervical: Superficial cervical adenopathy present. No deep or posterior cervical adenopathy.     Comments: Tender enlarged anterior cervical nodes especially on the left.  Rubbery, mobile.  No posterior cervical adenopathy.  Skin:    General: Skin is warm.     Findings: No rash.  Neurological:     Mental Status: He is alert and oriented to person, place, and time.      UC Treatments / Results  Labs (all labs ordered are listed, but only abnormal results are displayed) Labs Reviewed  STREP A DNA PROBE  POCT RAPID STREP A (OFFICE)  POCT INFLUENZA A/B   Rapid strep test negative. Strep culture sent Rapid flu tests negative.    Initial Impression / Assessment and Plan / UC Course  I have reviewed the triage vital signs and the nursing notes.  Pertinent labs & imaging results that were available during my care of the patient were reviewed by me and considered in my medical decision making (see chart for details).     Although rapid strep test negative, I'm very suspicious of strep as cause for his significant exudative tonsillitis and anteriorcervical adenopathy. Quick flu tests negative, and he likely does not have influenza. Explained to him that even if he had influenza A, he is outside the window for treatment. We'll cover strep, anaerobes and other bacteria with Augmentin prescription  today.  Final Clinical Impressions(s) / UC Diagnoses   Final diagnoses:  Exudative tonsillitis  Influenza-like symptoms  Sore throat   Treatment options discussed, as well as risks, benefits, alternatives. Patient voiced understanding and agreement with the following plans: (An After Visit Summary was printed and given to the patient.)   Discharge Instructions     You have tonsillitis, tonsil infection, especially left tonsil. Rapid strep test negative, we are sending off  strep culture. Today, flu test negative. Printed prescription given for Augmentin, take twice a day with food for 10 days. Rest, drink plenty of fluids.  Tylenol or ibuprofen or Aleve as needed for pain or fever. Please read attached instruction sheet on tonsillitis. Please follow-up with Charlie, your PCP at primary care, in 7 to 10 days.-If you are still having fatigue or tonsillitis symptoms, you may consider discussing referral to ear nose and throat specialist. Note written to excuse from work Monday and Tuesday, anticipate returning to work Wednesday, 11/12/2018    ED Prescriptions    Medication Sig Dispense Auth. Provider   amoxicillin-clavulanate (AUGMENTIN) 875-125 MG tablet Take 1 tablet by mouth every 12 (twelve) hours. Take for 10 days. Take with food. 20 tablet Lajean ManesMassey, , MD     Other symptomatic care discussed. Advised not to vape and explained risks doing so. Precautions discussed. Red flags discussed. Questions invited and answered. Patient voiced understanding and agreement.    Lajean ManesMassey, , MD 11/09/18 1932    Lajean ManesMassey, , MD 11/09/18 1945

## 2018-11-09 NOTE — ED Triage Notes (Signed)
Patient c/o sore throat or swollen spot in throat, history of mono a couple of months ago, feeling really weak.

## 2018-11-09 NOTE — Discharge Instructions (Addendum)
You have tonsillitis, tonsil infection, especially left tonsil. Rapid strep test negative, we are sending off strep culture. Today, flu test negative. Printed prescription given for Augmentin, take twice a day with food for 10 days. Rest, drink plenty of fluids.  Tylenol or ibuprofen or Aleve as needed for pain or fever. Please read attached instruction sheet on tonsillitis. Please follow-up with Charlie, your PCP at primary care, in 7 to 10 days.-If you are still having fatigue or tonsillitis symptoms, you may consider discussing referral to ear nose and throat specialist. Note written to excuse from work Monday and Tuesday, anticipate returning to work Wednesday, 11/12/2018

## 2018-11-10 ENCOUNTER — Ambulatory Visit (INDEPENDENT_AMBULATORY_CARE_PROVIDER_SITE_OTHER): Payer: Managed Care, Other (non HMO) | Admitting: Physician Assistant

## 2018-11-10 ENCOUNTER — Encounter: Payer: Self-pay | Admitting: Physician Assistant

## 2018-11-10 ENCOUNTER — Telehealth: Payer: Self-pay

## 2018-11-10 VITALS — BP 138/80 | HR 98 | Temp 99.0°F | Wt 169.0 lb

## 2018-11-10 DIAGNOSIS — B999 Unspecified infectious disease: Secondary | ICD-10-CM | POA: Diagnosis not present

## 2018-11-10 DIAGNOSIS — D72825 Bandemia: Secondary | ICD-10-CM | POA: Diagnosis not present

## 2018-11-10 DIAGNOSIS — R509 Fever, unspecified: Secondary | ICD-10-CM | POA: Diagnosis not present

## 2018-11-10 DIAGNOSIS — J039 Acute tonsillitis, unspecified: Secondary | ICD-10-CM

## 2018-11-10 DIAGNOSIS — J0391 Acute recurrent tonsillitis, unspecified: Secondary | ICD-10-CM

## 2018-11-10 DIAGNOSIS — Z8619 Personal history of other infectious and parasitic diseases: Secondary | ICD-10-CM

## 2018-11-10 LAB — STREP A DNA PROBE: Group A Strep Probe: NOT DETECTED

## 2018-11-10 NOTE — Progress Notes (Signed)
HPI:                                                                Seth Torres is a 23 y.o. male who [facility] today for throat pain/fever  23 yo M presents with recurrent tonsilitis and fever for the last 10 months. Fever has been high as 104 in the last 6 months.He has had 5 urgent care/ED visits for tonsillitis and 8 sick visits total in the last year.  He was most recently diagnosed with influenza B on September 21, 2018 and treated with Tamiflu.  He reports the symptoms resolved.   Labs in March 2019 showed recent EBV infection. Reports most recent symptoms of sore throat worse on the left side, malaise, myalgia and fever of 100 degrees beginning approximately 5 days ago.  He was seen in our urgent care and point-of-care influenza as well as strep a and culture were all negative.  He was prescribed Augmentin, but is not currently taking the antibiotic. last fever documented at 100 last night.  Currently smokes 1/2 ppd.  No flowsheet data found.  No flowsheet data found.    Past Medical History:  Diagnosis Date  . Ganglion cyst of dorsum of right wrist   . Mononucleosis    No past surgical history on file. Social History   Tobacco Use  . Smoking status: Current Every Day Smoker    Types: E-cigarettes  . Smokeless tobacco: Never Used  Substance Use Topics  . Alcohol use: Yes   family history is not on file.  Review of Systems  Constitutional: Positive for appetite change, fatigue and fever.  HENT: Positive for sore throat.      Medications: Current Outpatient Medications  Medication Sig Dispense Refill  . amoxicillin-clavulanate (AUGMENTIN) 875-125 MG tablet Take 1 tablet by mouth every 12 (twelve) hours. Take for 10 days. Take with food. 20 tablet 0   No current facility-administered medications for this visit.    No Known Allergies     Objective:  BP 138/80   Pulse 98   Temp 99 F (37.2 C)  (Oral)   Wt 169 lb (76.7 kg)   BMI 25.70 kg/m  Gen:  alert, ill-appearing, not toxic appearing, no distress, appropriate for age HEENT: head normocephalic without obvious abnormality, conjunctiva and cornea clear, tonsils grade 2+ with bilateral exudates, neck supple, posterior cervical adenopathy, trachea midline Pulm: Normal work of breathing, normal phonation, clear to auscultation bilaterally, no wheezes, rales or rhonchi CV: Normal rate, regular rhythm, s1 and s2 distinct, no murmurs, clicks or rubs  GI: Abdomen soft, nontender, no organomegaly Neuro: alert and oriented x 3, no tremor MSK: extremities atraumatic, normal gait and station Skin: intact, no rashes on exposed skin, no jaundice, no cyanosis    Results for orders placed or performed during the hospital encounter of 11/09/18 (from the past 72 hour(s))  POCT rapid strep A     Status: None   Collection Time: 11/09/18  5:21 PM  Result Value Ref Range   Rapid Strep A Screen Negative Negative  POCT Influenza A/B     Status: None   Collection Time: 11/09/18  5:21 PM  Result Value Ref Range   Influenza A, POC  Negative Negative   Influenza B, POC Negative Negative  Strep A DNA probe     Status: None   Collection Time: 11/09/18  5:22 PM  Result Value Ref Range   Group A Strep Probe NOT DETECTED NOT DETECT   No results found.    Assessment and Plan: 23 y.o. male with   .Schon was seen today for sore throat.  Diagnoses and all orders for this visit:  Acute tonsillitis, unspecified etiology  Recurrent infections -     Cancel: ANA -     Cancel: C-reactive protein -     Comprehensive metabolic panel -     Cancel: CBC with Differential/Platelet -     Cancel: Sedimentation rate -     Cancel: IgG, IgA, IgM -     Cancel: Pathologist smear review -     Cancel: Urinalysis, Routine w reflex microscopic  Febrile illness -     Cancel: ANA -     Cancel: C-reactive protein -     Comprehensive metabolic panel -      Cancel: CBC with Differential/Platelet -     Cancel: Sedimentation rate -     Cancel: IgG, IgA, IgM -     Cancel: Pathologist smear review -     Cancel: Urinalysis, Routine w reflex microscopic  Bandemia -     Cancel: CBC with Differential/Platelet -     Cancel: Pathologist smear review  Recurrent tonsillitis -     Ambulatory referral to ENT   23 year old otherwise healthy male smoker presenting with recurrent tonsillitis and fever over the last 10 months since initial diagnosis with infectious mono.  Symptoms have been waxing and waning.  Most recent symptoms began approximately 4 days ago.  He is ill-appearing today and has bilateral tonsillar exudates and posterior cervical adenopathy.  Both rapid and strep a probe were negative.  Point-of-care influenza testing negative.  At this point I think he is a surgical candidate for tonsillectomy.  I am also ordering labs to rule out an immune deficiency or autoimmune process.  I encouraged him to consider smoking cessation and explained that smoking weakens the immune system.  Counseled on supportive care for viral infection and fever.  Okay for him to begin Augmentin, though I suspect this is a viral etiology.   Patient education and anticipatory guidance given Patient agrees with treatment plan Follow-up as needed if symptoms worsen or fail to improve  Levonne Hubert PA-C

## 2018-11-10 NOTE — Patient Instructions (Addendum)
Fever, Adult         A fever is an increase in your body's temperature. It often means a temperature of 100.4F (38C) or higher. Brief mild or moderate fevers often have no long-term effects. They often do not need treatment. Moderate or high fevers may make you feel uncomfortable. Sometimes, they can be a sign of a serious illness or disease. A fever that keeps coming back or that lasts a long time may cause you to lose water in your body (get dehydrated).  You can take your temperature with a thermometer to see if you have a fever. Temperature can change with:   Age.   Time of day.   Where the thermometer is put in the body. Readings may vary when the thermometer is put:  ? In the mouth (oral).  ? In the butt (rectal).  ? In the ear (tympanic).  ? Under the arm (axillary).  ? On the forehead (temporal).  Follow these instructions at home:  Medicines   Take over-the-counter and prescription medicines only as told by your doctor. Follow the dosing instructions carefully.   If you were prescribed an antibiotic medicine, take it as told by your doctor. Do not stop taking it even if you start to feel better.  General instructions   Watch for any changes in your symptoms. Tell your doctor about them.   Rest as needed.   Drink enough fluid to keep your pee (urine) pale yellow.   Sponge yourself or bathe with room-temperature water as needed. This helps to lower your body temperature. Do not use ice water.   Do not use too many blankets or wear clothes that are too heavy.   If your fever was caused by an infection that spreads from person to person (is contagious), such as a cold or the flu:  ? You should stay home from work and public places for at least 24 hours after your fever is gone.  ? Your fever should be gone for at least 24 hours without the need to use medicines.  Contact a doctor if:   You throw up (vomit).   You cannot eat or drink without throwing up.   You have watery poop (diarrhea).   It  hurts when you pee.   Your symptoms do not get better with treatment.   You have new symptoms.   You feel very weak.  Get help right away if:   You are short of breath or have trouble breathing.   You are dizzy or you pass out (faint).   You feel mixed up (confused).   You have signs of not having enough water in your body, such as:  ? Dark pee, very little pee, or no pee.  ? Cracked lips.  ? Dry mouth.  ? Sunken eyes.  ? Sleepiness.  ? Weakness.   You have very bad pain in your belly (abdomen).   You keep throwing up or having watery poop.   You have a rash on your skin.   Your symptoms get worse all of a sudden.  Summary   A fever is an increase in your body's temperature. It often means a temperature of 100.4F (38C) or higher.   Watch for any changes in your symptoms. Tell your doctor about them.   Take all medicines only as told by your doctor.   Do not go to work or other public places if your fever was caused by an illness that can spread   to other people.   Get help right away if you have signs that you do not have enough water in your body.  This information is not intended to replace advice given to you by your health care provider. Make sure you discuss any questions you have with your health care provider.  Document Released: 07/10/2008 Document Revised: 03/17/2018 Document Reviewed: 03/17/2018  Elsevier Interactive Patient Education  2019 Elsevier Inc[employer]

## 2018-11-10 NOTE — Telephone Encounter (Signed)
Pt seeing Gena Fray today.

## 2018-11-10 NOTE — Telephone Encounter (Signed)
Patient scheduled.

## 2018-11-11 ENCOUNTER — Ambulatory Visit (INDEPENDENT_AMBULATORY_CARE_PROVIDER_SITE_OTHER): Payer: Managed Care, Other (non HMO) | Admitting: Physician Assistant

## 2018-11-11 ENCOUNTER — Encounter: Payer: Self-pay | Admitting: Physician Assistant

## 2018-11-11 DIAGNOSIS — D72825 Bandemia: Secondary | ICD-10-CM

## 2018-11-11 DIAGNOSIS — B999 Unspecified infectious disease: Secondary | ICD-10-CM

## 2018-11-11 DIAGNOSIS — R509 Fever, unspecified: Secondary | ICD-10-CM

## 2018-11-11 NOTE — Progress Notes (Signed)
Pt in today for lab draw only. Labs were drawn and urine collected and sent to lab. Pt tolerated procedure well.

## 2018-11-12 LAB — TIQ-MISC

## 2018-11-13 ENCOUNTER — Telehealth: Payer: Self-pay

## 2018-11-13 DIAGNOSIS — B999 Unspecified infectious disease: Secondary | ICD-10-CM | POA: Insufficient documentation

## 2018-11-13 DIAGNOSIS — Z8619 Personal history of other infectious and parasitic diseases: Secondary | ICD-10-CM | POA: Insufficient documentation

## 2018-11-13 NOTE — Telephone Encounter (Signed)
[facility] called and states they are unable to run the urine. It wasn't transferred to a stockwell tube.

## 2018-11-13 NOTE — Telephone Encounter (Signed)
Still have not received ANA result Sent result note in lieu of ANA

## 2018-11-13 NOTE — Telephone Encounter (Signed)
See other phone note

## 2018-11-13 NOTE — Telephone Encounter (Signed)
Marke called yesterday and today for results. I advised him the ANA has not resulted yet. I did call [facility] and it is due to result this afternoon.

## 2018-11-13 NOTE — Telephone Encounter (Signed)
What about the ANA?

## 2018-11-14 LAB — COMPLETE METABOLIC PANEL WITH GFR
AG Ratio: 1.5 (calc) (ref 1.0–2.5)
ALT: 10 U/L (ref 9–46)
AST: 15 U/L (ref 10–40)
Albumin: 4.6 g/dL (ref 3.6–5.1)
Alkaline phosphatase (APISO): 78 U/L (ref 40–115)
BILIRUBIN TOTAL: 0.7 mg/dL (ref 0.2–1.2)
BUN: 15 mg/dL (ref 7–25)
CO2: 28 mmol/L (ref 20–32)
Calcium: 9.9 mg/dL (ref 8.6–10.3)
Chloride: 97 mmol/L — ABNORMAL LOW (ref 98–110)
Creat: 1.19 mg/dL (ref 0.60–1.35)
GFR, Est African American: 101 mL/min/{1.73_m2} (ref >=60)
GFR, Est Non African American: 87 mL/min/{1.73_m2} (ref >=60)
Globulin: 3.1 g/dL (calc) (ref 1.9–3.7)
Glucose, Bld: 94 mg/dL (ref 65–99)
Potassium: 4.2 mmol/L (ref 3.5–5.3)
Sodium: 136 mmol/L (ref 135–146)
TOTAL PROTEIN: 7.7 g/dL (ref 6.1–8.1)

## 2018-11-14 LAB — CBC WITH DIFFERENTIAL/PLATELET
Absolute Monocytes: 1416 cells/uL — ABNORMAL HIGH (ref 200–950)
Basophils Absolute: 35 cells/uL (ref 0–200)
Basophils Relative: 0.3 %
EOS PCT: 0.1 %
Eosinophils Absolute: 12 cells/uL — ABNORMAL LOW (ref 15–500)
HCT: 44.5 % (ref 38.5–50.0)
Hemoglobin: 15.3 g/dL (ref 13.2–17.1)
Lymphs Abs: 1652 cells/uL (ref 850–3900)
MCH: 25.9 pg — ABNORMAL LOW (ref 27.0–33.0)
MCHC: 34.4 g/dL (ref 32.0–36.0)
MCV: 75.4 fL — ABNORMAL LOW (ref 80.0–100.0)
MONOS PCT: 12 %
MPV: 12.3 fL (ref 7.5–12.5)
Neutro Abs: 8685 cells/uL — ABNORMAL HIGH (ref 1500–7800)
Neutrophils Relative %: 73.6 %
Platelets: 168 10*3/uL (ref 140–400)
RBC: 5.9 10*6/uL — ABNORMAL HIGH (ref 4.20–5.80)
RDW: 15.8 % — ABNORMAL HIGH (ref 11.0–15.0)
Total Lymphocyte: 14 %
WBC: 11.8 10*3/uL — ABNORMAL HIGH (ref 3.8–10.8)

## 2018-11-14 LAB — C-REACTIVE PROTEIN: CRP: 156.5 mg/L — AB (ref <8.0)

## 2018-11-14 LAB — IGG, IGA, IGM
IgG (Immunoglobin G), Serum: 1057 mg/dL (ref 600–1640)
IgM, Serum: 109 mg/dL (ref 50–300)
Immunoglobulin A: 245 mg/dL (ref 47–310)

## 2018-11-14 LAB — SEDIMENTATION RATE: Sed Rate: 19 mm/h — ABNORMAL HIGH (ref 0–15)

## 2018-11-14 LAB — ANA: Anti Nuclear Antibody(ANA): NEGATIVE

## 2018-11-14 LAB — PATHOLOGIST SMEAR REVIEW

## 2018-11-14 NOTE — Telephone Encounter (Signed)
See result note.  

## 2018-11-24 ENCOUNTER — Ambulatory Visit: Payer: Managed Care, Other (non HMO) | Admitting: Physician Assistant

## 2019-01-06 ENCOUNTER — Encounter: Payer: Self-pay | Admitting: Physician Assistant

## 2019-01-06 ENCOUNTER — Telehealth (INDEPENDENT_AMBULATORY_CARE_PROVIDER_SITE_OTHER): Payer: Managed Care, Other (non HMO) | Admitting: Physician Assistant

## 2019-01-06 ENCOUNTER — Other Ambulatory Visit: Payer: Self-pay

## 2019-01-06 DIAGNOSIS — Z20818 Contact with and (suspected) exposure to other bacterial communicable diseases: Secondary | ICD-10-CM | POA: Diagnosis not present

## 2019-01-06 DIAGNOSIS — J029 Acute pharyngitis, unspecified: Secondary | ICD-10-CM

## 2019-01-06 NOTE — Progress Notes (Addendum)
Virtual Visit via Telephone Note  I connected with Seth Torres by telephone and verified that I am speaking with the correct person using two identifiers.   I discussed the limitations, risks, security and privacy concerns of performing an evaluation and management service by telephone and the availability of in person appointments. I also discussed with the patient that there may be a patient responsible charge related to this service. The patient expressed understanding and agreed to proceed.   History of Present Illness:   Sore Throat   This is a new problem. The current episode started yesterday. The problem has been unchanged. Neither side of throat is experiencing more pain than the other. There has been no fever. The pain is moderate. Associated symptoms include swollen glands. Pertinent negatives include no coughing, trouble swallowing or vomiting. He has had no exposure to strep. He has tried nothing for the symptoms.  He states his tonsils do not appear swollen or have any exudates.     Past Medical History:  Diagnosis Date  . Ganglion cyst of dorsum of right wrist   . Mononucleosis    No past surgical history on file. Social History   Tobacco Use  . Smoking status: Current Every Day Smoker    Types: E-cigarettes  . Smokeless tobacco: Never Used  Substance Use Topics  . Alcohol use: Yes   family history is not on file.    Review of Systems  Constitutional: Positive for appetite change (decreased) and fatigue. Negative for fever.       + malaise  HENT: Positive for sore throat. Negative for trouble swallowing and voice change.        + throat clearing  Respiratory: Negative for cough.   Gastrointestinal: Negative for vomiting.  All other systems reviewed and are negative.    Medications: Current Outpatient Medications  Medication Sig Dispense Refill  . amoxicillin-clavulanate (AUGMENTIN) 875-125 MG tablet Take 1 tablet by mouth every 12 (twelve) hours.  Take for 10 days. Take with food. 20 tablet 0   No current facility-administered medications for this visit.    No Known Allergies     Objective:  There were no vitals taken for this visit. Pulm: Normal work of breathing, normal phonation, speaking in full sentences     Assessment and Plan: 23 y.o. male with   .Diagnoses and all orders for this visit:  Sore throat   Centor score=2 points Discussed that likelihood of Strep pharyngitis is 11-17% In absence of ability to perform POC Strep testing, recommended empiric treatment. Patient declined and states he really does not feel this is Strep. Risks and benefits explained. Counseled on supportive care  Recommended patient take quarantine precautions - remain out of work for the next 7 days and not to return until without fever/respiratory symptoms for 72 hours He was instructed to set up MyChart and send me a message so I can provide him with a work note I discussed the assessment and treatment plan with the patient. The patient was provided an opportunity to ask questions and all were answered. The patient agreed with the plan and demonstrated an understanding of the instructions.   The patient was advised to call back or seek an in-person evaluation if the symptoms worsen or if the condition fails to improve as anticipated.  I provided 5-10 minutes of non-face-to-face time during this encounter.   Carlis Stable, [place]

## 2019-01-07 ENCOUNTER — Telehealth: Payer: Self-pay

## 2019-01-07 DIAGNOSIS — J029 Acute pharyngitis, unspecified: Secondary | ICD-10-CM

## 2019-01-07 MED ORDER — PENICILLIN V POTASSIUM 500 MG PO TABS: 500.0000 mg | ORAL_TABLET | Freq: Two times a day (BID) | ORAL | 0 refills | Status: AC

## 2019-01-07 NOTE — Telephone Encounter (Signed)
Seth Torres called and states he now has nausea, white patches in his throat and a fever of 100.00. Please advise.

## 2019-01-07 NOTE — Telephone Encounter (Signed)
Recommend treating this as Strep pharyngitis Penicillin twice daily for 10 days Rx sent to pharmacy

## 2019-01-08 NOTE — Telephone Encounter (Signed)
Patient advised.

## 2019-03-23 ENCOUNTER — Emergency Department (INDEPENDENT_AMBULATORY_CARE_PROVIDER_SITE_OTHER)
Admission: EM | Admit: 2019-03-23 | Discharge: 2019-03-23 | Disposition: A | Discharge status: Home / Self Care | Payer: Managed Care, Other (non HMO) | Source: Home / Self Care

## 2019-03-23 ENCOUNTER — Other Ambulatory Visit: Payer: Self-pay

## 2019-03-23 DIAGNOSIS — F1010 Alcohol abuse, uncomplicated: Secondary | ICD-10-CM

## 2019-03-23 DIAGNOSIS — R17 Unspecified jaundice: Secondary | ICD-10-CM | POA: Diagnosis not present

## 2019-03-23 DIAGNOSIS — F17200 Nicotine dependence, unspecified, uncomplicated: Secondary | ICD-10-CM

## 2019-03-23 LAB — POCT CBC W AUTO DIFF (K'VILLE URGENT CARE)

## 2019-03-23 NOTE — Discharge Instructions (Addendum)
°  You will be notified of your blood test results in about 2 days. In the meantime, try to stay well hydrated and eat healthy. Please call your family doctor to schedule a follow up visit later this week to recheck your symptoms and review this evening's lab work in case additional testing or treatment is indicated.

## 2019-03-23 NOTE — ED Triage Notes (Signed)
Pt noticed that eyes were yellow today.  Drank a fifth of Barnabas Lister on Wednesday night.

## 2019-03-23 NOTE — ED Provider Notes (Addendum)
[facility]    CSN: [id number] Arrival date & time: 03/23/19  1714     History   Chief Complaint Chief Complaint  Patient presents with  . Eye Problem    HPI Seth Torres is a 23 y.o. male.   HPI Seth Torres is a 23 y.o. male presenting to UC with c/o yellowing of his eyes that he noticed around 4:45PM today.  Pt notes he otherwise feels fine. Denies fever, chills, n/v/d. Denies sweats or weight loss. Denise abdominal pain or urinary symptoms.  He does admit that he drank a fifth of Shearon Stalls whiskey on Wednesday night, 03/18/2019. Pt notes he does not drink often but when he does he drinks straight liquor.  Last time was about 1 month ago.  Denies using IV drugs. Denies known hx of hepatitis.  Denies recent travel.  He does not take any daily medications or supplements.  He does smoke e-cigarettes daily.    Past Medical History:  Diagnosis Date  . Ganglion cyst of dorsum of right wrist   . Mononucleosis     Patient Active Problem List   Diagnosis Date Noted  . Recurrent infections 11/13/2018  . History of infectious mononucleosis 11/13/2018  . Bilateral impacted cerumen 05/18/2018  . Randell Patient virus infection 05/18/2018  . Acute tonsillitis due to infectious mononucleosis 01/06/2018  . Cardiac murmur 01/02/2018  . 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 Closed nondisplaced avulsion fracture of left talus 03/07/2017  . Ganglion cyst of dorsum of right wrist 11/29/2016    History reviewed. No pertinent surgical history.     Home Medications    Prior to Admission medications   Not on File    Family History History reviewed. No pertinent family history.  Social History Social History   Tobacco Use  . Smoking status: Current Every Day Smoker    Types: E-cigarettes  . Smokeless tobacco: Never Used  Substance Use Topics  . Alcohol use: Yes  . Drug use: Yes    Types: Marijuana     Allergies   Patient has no known allergies.   Review  of Systems Review of Systems  Constitutional: Negative for chills and fever.  HENT: Negative for congestion, ear pain, sore throat, trouble swallowing and voice change.   Eyes:       Jaundice of both eyes  Respiratory: Negative for cough and shortness of breath.   Cardiovascular: Negative for chest pain and palpitations.  Gastrointestinal: Negative for abdominal pain, diarrhea, nausea and vomiting.  Musculoskeletal: Negative for arthralgias, back pain and myalgias.  Skin: Negative for rash.  Neurological: Negative for dizziness, syncope, weakness, light-headedness and headaches.     Physical Exam Triage Vital Signs  No data found.  Updated Vital Signs BP 131/77 (BP Location: Right Arm)   Pulse 86   Temp 98.4 F (36.9 C) (Oral)   Resp 20   Ht 5\' 8"  (1.727 m)   Wt 176 lb (79.8 kg)   SpO2 99%   BMI 26.76 kg/m   Visual Acuity Right Eye Distance:   Left Eye Distance:   Bilateral Distance:    Right Eye Near:   Left Eye Near:    Bilateral Near:     Physical Exam Vitals signs and nursing note reviewed.  Constitutional:      Appearance: Normal appearance. He is well-developed.  HENT:     Head: Normocephalic and atraumatic.     Mouth/Throat:     Mouth: Mucous membranes are moist.  Eyes:  General: Scleral icterus present.     Extraocular Movements: Extraocular movements intact.     Conjunctiva/sclera: Conjunctivae normal.     Pupils: Pupils are equal, round, and reactive to light.  Neck:     Musculoskeletal: Normal range of motion and neck supple.  Cardiovascular:     Rate and Rhythm: Normal rate and regular rhythm.     Comments: Bradycardic in triage, regular rate and rhythm on exam Pulmonary:     Effort: Pulmonary effort is normal.     Breath sounds: Normal breath sounds.  Abdominal:     General: There is no distension.     Palpations: Abdomen is soft. There is no hepatomegaly or splenomegaly.     Tenderness: There is no abdominal tenderness. There is no  right CVA tenderness or left CVA tenderness.  Musculoskeletal: Normal range of motion.  Skin:    General: Skin is warm and dry.     Capillary Refill: Capillary refill takes less than 2 seconds.  Neurological:     Mental Status: He is alert and oriented to person, place, and time.  Psychiatric:        Behavior: Behavior normal.      UC Treatments / Results  Labs (all labs ordered are listed, but only abnormal results are displayed) Labs Reviewed  COMPLETE METABOLIC PANEL WITH GFR  HEPATITIS PANEL, ACUTE  PROTIME-INR  POCT CBC W AUTO DIFF ([facility])    EKG None  Radiology No results found.  Procedures Procedures (including critical care time)  Medications Ordered in UC Medications - No data to display  Initial Impression / Assessment and Plan / UC Course  I have reviewed the triage vital signs and the nursing notes.  Pertinent labs & imaging results that were available during my care of the patient were reviewed by me and considered in my medical decision making (see chart for details).     Pt does have jaundice Pt reports binge drinking last week. No additional alcohol consumption since then. Pt denies use of other substances.  Pt is otherwise asymptomatic. Lungs: CTAB Abd: soft, non-tender, no masses No evidence of emergent process taking place at this time.   CBC: unremarkable CMP, Hepatitis panel, and INR- pending Strongly encouraged f/u with PCP and discouraged alcohol consumption and smoking Resource guide provided within AVS  Final Clinical Impressions(s) / UC Diagnoses   Final diagnoses:  Jaundice  Alcohol consumption binge drinking  Smoker     Discharge Instructions      You will be notified of your blood test results in about 2 days. In the meantime, try to stay well hydrated and eat healthy. Please call your family doctor to schedule a follow up visit later this week to recheck your symptoms and review this evening's lab work in  case additional testing or treatment is indicated.      ED Prescriptions    None     Controlled Substance Prescriptions [place] Controlled Substance Registry consulted? Not Applicable   Rolla Platehelps, Junnie Loschiavo O, PA-C 03/23/19 1915    Lurene ShadowPhelps, Jarryd Gratz O, [place] 03/23/19 1921

## 2019-03-24 ENCOUNTER — Telehealth: Payer: Self-pay | Admitting: *Deleted

## 2019-03-24 LAB — HEPATITIS PANEL, ACUTE
Hep A IgM: NONREACTIVE
Hep B C IgM: NONREACTIVE
Hepatitis B Surface Ag: NONREACTIVE
Hepatitis C Ab: NONREACTIVE
SIGNAL TO CUT-OFF: 0.01 (ref <1.00)

## 2019-03-24 LAB — COMPLETE METABOLIC PANEL WITH GFR
AG Ratio: 1.9 (calc) (ref 1.0–2.5)
ALT: 9 U/L (ref 9–46)
AST: 15 U/L (ref 10–40)
Albumin: 4.8 g/dL (ref 3.6–5.1)
Alkaline phosphatase (APISO): 66 U/L (ref 36–130)
BUN: 15 mg/dL (ref 7–25)
CO2: 25 mmol/L (ref 20–32)
Calcium: 9.9 mg/dL (ref 8.6–10.3)
Chloride: 102 mmol/L (ref 98–110)
Creat: 1.08 mg/dL (ref 0.60–1.35)
GFR, Est African American: 113 mL/min/{1.73_m2} (ref >=60)
GFR, Est Non African American: 98 mL/min/{1.73_m2} (ref >=60)
Globulin: 2.5 g/dL (calc) (ref 1.9–3.7)
Glucose, Bld: 90 mg/dL (ref 65–99)
Potassium: 3.8 mmol/L (ref 3.5–5.3)
Sodium: 138 mmol/L (ref 135–146)
Total Bilirubin: 0.7 mg/dL (ref 0.2–1.2)
Total Protein: 7.3 g/dL (ref 6.1–8.1)

## 2019-03-24 LAB — PROTIME-INR
INR: 1.1
Prothrombin Time: 11 s (ref 9.0–11.5)

## 2019-03-24 NOTE — Telephone Encounter (Signed)
Pt notified of lab results. Advised him do not consume any alcohol and schedule a f/u appt with Cummings,PA.

## 2020-01-08 ENCOUNTER — Ambulatory Visit (INDEPENDENT_AMBULATORY_CARE_PROVIDER_SITE_OTHER): Payer: 59 | Admitting: Medical-Surgical

## 2020-01-08 ENCOUNTER — Other Ambulatory Visit: Payer: Self-pay

## 2020-01-08 ENCOUNTER — Encounter: Payer: Self-pay | Admitting: Medical-Surgical

## 2020-01-08 VITALS — BP 129/66 | HR 72

## 2020-01-08 DIAGNOSIS — M67431 Ganglion, right wrist: Secondary | ICD-10-CM

## 2020-01-08 DIAGNOSIS — Z23 Encounter for immunization: Secondary | ICD-10-CM

## 2020-01-08 NOTE — Progress Notes (Signed)
Pt here to receive his Tdap and Hep B vaccines.   He tolerated both vaccinations well. Form completed and given back to patient. Copy made and scanned into chart. Laureen Ochs, Viann Shove, CMA

## 2020-01-08 NOTE — Progress Notes (Signed)
Subjective:    CC: Need for vaccination, cyst on right wrist, establish care  HPI: Very pleasant 24 year old male presenting today for update on Tdap and hepatitis B vaccines.  [facility] patient who would like to establish with new PCP.  Also reports he has a small swollen lump on his right wrist that has been present for approximately 10 years.  Lump is painful at times and interferes with range of motion of wrist.  Has been evaluated several times prior but has had no intervention as he was told nothing less than surgery would take care of the issue.  I reviewed the past medical history, family history, social history, surgical history, and allergies today and no changes were needed.  Please see the problem list section below in epic for further details.  Past Medical History: Past Medical History:  Diagnosis Date  . Ganglion cyst of dorsum of right wrist   . Mononucleosis    Past Surgical History: No past surgical history on file. Social History: Social History   Socioeconomic History  . Marital status: Single    Spouse name: Not on file  . Number of children: Not on file  . Years of education: Not on file  . Highest education level: Not on file  Occupational History  . Not on file  Tobacco Use  . Smoking status: Current Every Day Smoker    Types: E-cigarettes  . Smokeless tobacco: Never Used  Substance and Sexual Activity  . Alcohol use: Yes  . Drug use: Yes    Types: Marijuana  . Sexual activity: Yes  Other Topics Concern  . Not on file  Social History Narrative  . Not on file   Social Determinants of Health   Financial Resource Strain:   . Difficulty of Paying Living Expenses:   Food Insecurity:   . Worried About Charity fundraiser in the Last Year:   . Arboriculturist in the Last Year:   Transportation Needs:   . Film/video editor (Medical):   Marland Kitchen Lack of Transportation (Non-Medical):   Physical Activity:   . Days of Exercise per Week:   .  Minutes of Exercise per Session:   Stress:   . Feeling of Stress :   Social Connections:   . Frequency of Communication with Friends and Family:   . Frequency of Social Gatherings with Friends and Family:   . Attends Religious Services:   . Active Member of Clubs or Organizations:   . Attends Archivist Meetings:   Marland Kitchen Marital Status:    Family History: No family history on file. Allergies: No Known Allergies Medications: See med rec.  Review of Systems: No fevers, chills, night sweats, weight loss, chest pain, or shortness of breath.   Objective:    General: Well Developed, well nourished, and in no acute distress.  Neuro: Alert and oriented x3.  HEENT: Normocephalic, atraumatic.  Skin: Warm and dry.  1.5 cm round ganglion cyst to dorsal right wrist, no erythema. Cardiac: Regular rate and rhythm, no murmurs rubs or gallops, no lower extremity edema.  Respiratory: Clear to auscultation bilaterally. Not using accessory muscles, speaking in full sentences.   Impression and Recommendations:    1. Need for tetanus, diphtheria, and acellular pertussis (Tdap) vaccine in patient of adolescent age or older Tdap given by MA in office. - Tdap vaccine greater than or equal to 7yo IM  2. Need for hepatitis B vaccination Second hepatitis B vaccination given in office  today by MA.  Will need third vaccine at the 77-month mark. - Hepatitis B vaccine adult IM  3. Ganglion cyst of dorsum of right wrist Discussed treatment options and risk of recurrence of ganglion cyst on right wrist.  Patient wishes to go ahead with treatment.  Advised to wait approximately 2 weeks to avoid interference of vaccine response from steroid injection.  Return in about 2 weeks (around 01/22/2020) for ganglion cyst with Dr. Karie Schwalbe. ___________________________________________ [name], DNP, APRN, FNP-BC Primary Care and Sports Medicine [facility] [place]

## 2020-01-25 ENCOUNTER — Ambulatory Visit: Payer: Managed Care, Other (non HMO) | Admitting: Sports Medicine

## 2020-01-29 ENCOUNTER — Other Ambulatory Visit: Payer: Self-pay

## 2020-01-29 ENCOUNTER — Ambulatory Visit (INDEPENDENT_AMBULATORY_CARE_PROVIDER_SITE_OTHER): Payer: 59 | Admitting: Sports Medicine

## 2020-01-29 DIAGNOSIS — M67431 Ganglion, right wrist: Secondary | ICD-10-CM

## 2020-01-29 NOTE — Progress Notes (Signed)
    Procedures performed today:    Procedure: Real-time Ultrasound Guided  aspiration/injection of right dorsal wrist ganglion Device: Samsung HS60  Verbal informed consent obtained.  Time-out conducted.  Noted no overlying erythema, induration, or other signs of local infection.  Skin prepped in a sterile fashion.  Local anesthesia: Topical Ethyl chloride.  With sterile technique and under real time ultrasound guidance:  Using 18-gauge needle aspirated half a milliliter of thick, straw-colored fluid, syringe switched and 1/2 cc Kenalog 40, 1/2 cc lidocaine injected easily.  Completed without difficulty  Pain immediately resolved suggesting accurate placement of the medication.  Advised to call if fevers/chills, erythema, induration, drainage, or persistent bleeding.  Images permanently stored and available for review in the ultrasound unit.  Impression: Technically successful ultrasound guided injection.  Independent interpretation of notes and tests performed by another provider:   None.  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 Today we performed a ganglion cyst aspiration and injection, I did explain the 50% recurrence rate, and that we would try 3 aspiration and injection procedures before considering surgery. If it does recur, and is asymptomatic then we will just leave alone, return to see me in 1 month.    ___________________________________________ Ihor Austin. Benjamin Stain, M.D., ABFM., CAQSM. Primary Care and Sports Medicine [facility]  Adjunct Instructor of Family Medicine  [employer]

## 2020-01-29 NOTE — Assessment & Plan Note (Signed)
This is a pleasant 24 year old male, he has noted a ganglion cyst on the dorsum of his right wrist, minimally tender, present for 11 years. Today we performed a ganglion cyst aspiration and injection, I did explain the 50% recurrence rate, and that we would try 3 aspiration and injection procedures before considering surgery. If it does recur, and is asymptomatic then we will just leave alone, return to see me in 1 month.

## 2020-02-29 ENCOUNTER — Ambulatory Visit: Payer: 59 | Admitting: Sports Medicine

## 2020-12-29 ENCOUNTER — Emergency Department
Admission: EM | Admit: 2020-12-29 | Discharge: 2020-12-29 | Disposition: A | Discharge status: Home / Self Care | Payer: 59 | Source: Home / Self Care | Attending: Family Medicine | Admitting: Family Medicine

## 2020-12-29 ENCOUNTER — Other Ambulatory Visit: Payer: Self-pay

## 2020-12-29 ENCOUNTER — Encounter: Payer: Self-pay | Admitting: Emergency Medicine

## 2020-12-29 DIAGNOSIS — R1013 Epigastric pain: Secondary | ICD-10-CM

## 2020-12-29 DIAGNOSIS — K219 Gastro-esophageal reflux disease without esophagitis: Secondary | ICD-10-CM

## 2020-12-29 DIAGNOSIS — R1084 Generalized abdominal pain: Secondary | ICD-10-CM | POA: Diagnosis not present

## 2020-12-29 LAB — POCT URINALYSIS DIP (MANUAL ENTRY)
Bilirubin, UA: NEGATIVE
Blood, UA: NEGATIVE
Glucose, UA: NEGATIVE mg/dL
Ketones, POC UA: NEGATIVE mg/dL
Leukocytes, UA: NEGATIVE
Nitrite, UA: NEGATIVE
Protein Ur, POC: NEGATIVE mg/dL
Spec Grav, UA: <=1.005 — AB (ref 1.010–1.025)
Urobilinogen, UA: 0.2 E.U./dL
pH, UA: 5.5 (ref 5.0–8.0)

## 2020-12-29 MED ORDER — OMEPRAZOLE 40 MG PO CPDR: 40.0000 mg | DELAYED_RELEASE_CAPSULE | Freq: Every day | ORAL | 1 refills | Status: DC

## 2020-12-29 NOTE — ED Provider Notes (Signed)
[name]    CSN: [id number] Arrival date & time: 12/29/20  0915      History   Chief Complaint Chief Complaint  Patient presents with  . Abdominal Pain    HPI Seth Torres is a 25 y.o. male.   HPI Seth Torres 24 year old gentleman.  Here for epigastric pain.  Is been bothering him off and on for 2 years.  Previously not bad enough to require medical care.  Today he awoke at 02 30 with severe pain.  He could not go back to sleep.  He states he paced around the house.  It would get better for a few minutes and then get worse again.  He had nausea but no vomiting.  He has a deep "hunger" gnawing pain in his epigastrium.  He states that it is worse after highly spiced foods.  He has not had any evaluation for this previously. Patient does smoke cigarettes.  He was advised to quit Patient drinks alcohol in moderation He states he takes Aleve at least 4 times a week for headaches.  He takes usually 3 or 4 tablets at a time.  He does sometimes take these on an empty stomach.  He was advised that this is is not recommended. Patient is otherwise in good health and on no medications Past Medical History:  Diagnosis Date  . Ganglion cyst of dorsum of right wrist   . Mononucleosis     Patient Active Problem List   Diagnosis Date Noted  . Recurrent infections 11/13/2018  . History of infectious mononucleosis 11/13/2018  . Bilateral impacted cerumen 05/18/2018  . 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 Acute tonsillitis due to infectious mononucleosis 01/06/2018  . Cardiac murmur 01/02/2018  . 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 Closed nondisplaced avulsion fracture of left talus 03/07/2017  . Ganglion cyst of dorsum of right wrist 11/29/2016    History reviewed. No pertinent surgical history.     Home Medications    Prior to Admission medications   Medication Sig Start Date End Date Taking? Authorizing Provider  omeprazole (PRILOSEC) 40 MG capsule Take 1  capsule (40 mg total) by mouth daily. 12/29/20  Yes Eustace Moore, MD    Family History Family History  Problem Relation Age of Onset  . Healthy Mother   . Healthy Father     Social History Social History   Tobacco Use  . Smoking status: Current Every Day Smoker    Packs/day: 0.50    Years: 9.00    Pack years: 4.50    Types: Cigarettes  . Smokeless tobacco: Former Neurosurgeon    Types: Engineer, drilling  . Vaping Use: Never used  Substance Use Topics  . Alcohol use: Not Currently  . Drug use: Not Currently     Allergies   Patient has no known allergies.   Review of Systems Review of Systems See HPI  Physical Exam Triage Vital Signs ED Triage Vitals  Enc Vitals Group     BP 12/29/20 1008 132/80     Pulse Rate 12/29/20 1008 69     Resp 12/29/20 1008 16     Temp 12/29/20 1008 98 F (36.7 C)     Temp Source 12/29/20 1008 Oral     SpO2 12/29/20 1008 100 %     Weight 12/29/20 1014 170 lb (77.1 kg)     Height 12/29/20 1014 5\' 9"  (1.753 m)     Head Circumference --      Peak  Flow --      Pain Score 12/29/20 1015 3     Pain Loc --      Pain Edu? --      Excl. in GC? --    No data found.  Updated Vital Signs BP 132/80 (BP Location: Left Arm)   Pulse 69   Temp 98 F (36.7 C) (Oral)   Resp 16   Ht 5\' 9"  (1.753 m)   Wt 77.1 kg   SpO2 100%   BMI 25.10 kg/m      Physical Exam Constitutional:      General: He is not in acute distress.    Appearance: He is well-developed and normal weight.  HENT:     Head: Normocephalic and atraumatic.  Eyes:     Conjunctiva/sclera: Conjunctivae normal.     Pupils: Pupils are equal, round, and reactive to light.  Cardiovascular:     Rate and Rhythm: Normal rate and regular rhythm.  Pulmonary:     Effort: Pulmonary effort is normal. No respiratory distress.  Abdominal:     General: Bowel sounds are normal. There is no distension.     Palpations: Abdomen is soft. There is no hepatomegaly or splenomegaly.      Tenderness: There is abdominal tenderness in the epigastric area. There is no guarding or rebound.  Musculoskeletal:        General: Normal range of motion.     Cervical back: Normal range of motion.  Skin:    General: Skin is warm and dry.  Neurological:     Mental Status: He is alert.  Psychiatric:        Behavior: Behavior normal.      UC Treatments / Results  Labs (all labs ordered are listed, but only abnormal results are displayed) Labs Reviewed  POCT URINALYSIS DIP (MANUAL ENTRY) - Abnormal; Notable for the following components:      Result Value   Spec Grav, UA <=1.005 (*)    All other components within normal limits    EKG   Radiology No results found.  Procedures Procedures (including critical care time)  Medications Ordered in UC Medications - No data to display  Initial Impression / Assessment and Plan / UC Course  I have reviewed the triage vital signs and the nursing notes.  Pertinent labs & imaging results that were available during my care of the patient were reviewed by me and considered in my medical decision making (see chart for details).     Reviewed with patient that he had periodic GERD symptoms with heartburn.  Seem to be related to spicy food.  Likely acid reflux symptoms with dyspepsia, no persistent symptoms to make me suspicious for GERD, gastritis, H. pylori.  Will treat symptomatically and have him follow-up with primary care Final Clinical Impressions(s) / UC Diagnoses   Final diagnoses:  Generalized abdominal pain  Epigastric pain  Gastroesophageal reflux disease, unspecified whether esophagitis present     Discharge Instructions     Take the omeprazole one time a day on an empty stomach Continue for one month After this may take as needed Avoid tobacco and alcohol Try switching to Excedrin for headache See PCP if not improved    ED Prescriptions    Medication Sig Dispense Auth. Provider   omeprazole (PRILOSEC) 40 MG  capsule Take 1 capsule (40 mg total) by mouth daily. 30 capsule , MD     PDMP not reviewed this encounter.   Eustace Moore,  MD 12/29/20 1734

## 2020-12-29 NOTE — Discharge Instructions (Signed)
Take the omeprazole one time a day on an empty stomach Continue for one month After this may take as needed Avoid tobacco and alcohol Try switching to Excedrin for headache See PCP if not improved

## 2020-12-29 NOTE — ED Triage Notes (Signed)
Cramping abdominal pain since 0230 Concerned about a stomach ulcer  No OTC meds  Pain occurs 2 x years Describes it as hunger pain  Hx of GERD - occasional tums No Covid vaccine

## 2021-02-20 ENCOUNTER — Telehealth: Payer: Self-pay | Admitting: General Practice

## 2021-02-20 NOTE — Telephone Encounter (Signed)
Transition Care Management Follow-up Telephone Call Date of discharge and from where: [facility] 02/19/21 How have you been since you were released from the hospital? Doing better overall.  Any questions or concerns? No  Items Reviewed: Did the pt receive and understand the discharge instructions provided? No  Medications obtained and verified? Yes  Other? No  Any new allergies since your discharge? No  Dietary orders reviewed? Yes Do you have support at home? No   Home Care and Equipment/Supplies: Were home health services ordered? no   Functional Questionnaire: (I = Independent and D = Dependent) ADLs: I  Bathing/Dressing- I  Meal Prep- I  Eating- I  Maintaining continence- I  Transferring/Ambulation- I  Managing Meds- I  Follow up appointments reviewed:  PCP Hospital f/u appt confirmed? Yes  Scheduled to see Christen Butter, NP on 02/27/21 @ 1300. Specialist Hospital f/u appt confirmed? No   Are transportation arrangements needed? No  If their condition worsens, is the pt aware to call PCP or go to the Emergency Dept.? Yes Was the patient provided with contact information for the PCP's office or ED? Yes Was to pt encouraged to call back with questions or concerns? Yes

## 2021-02-27 ENCOUNTER — Ambulatory Visit (INDEPENDENT_AMBULATORY_CARE_PROVIDER_SITE_OTHER): Payer: 59 | Admitting: Medical-Surgical

## 2021-02-27 ENCOUNTER — Encounter: Payer: Self-pay | Admitting: Medical-Surgical

## 2021-02-27 ENCOUNTER — Other Ambulatory Visit: Payer: Self-pay

## 2021-02-27 VITALS — BP 103/71 | HR 88 | Temp 98.7°F | Ht 69.0 in | Wt 213.0 lb

## 2021-02-27 DIAGNOSIS — Z09 Encounter for follow-up examination after completed treatment for conditions other than malignant neoplasm: Secondary | ICD-10-CM | POA: Diagnosis not present

## 2021-02-27 DIAGNOSIS — Z716 Tobacco abuse counseling: Secondary | ICD-10-CM

## 2021-02-27 DIAGNOSIS — E6609 Other obesity due to excess calories: Secondary | ICD-10-CM

## 2021-02-27 DIAGNOSIS — M67431 Ganglion, right wrist: Secondary | ICD-10-CM

## 2021-02-27 DIAGNOSIS — Z6834 Body mass index (BMI) 34.0-34.9, adult: Secondary | ICD-10-CM | POA: Insufficient documentation

## 2021-02-27 DIAGNOSIS — Z6831 Body mass index (BMI) 31.0-31.9, adult: Secondary | ICD-10-CM

## 2021-02-27 DIAGNOSIS — Z23 Encounter for immunization: Secondary | ICD-10-CM

## 2021-02-27 MED ORDER — OMEPRAZOLE 40 MG PO CPDR: 40.0000 mg | DELAYED_RELEASE_CAPSULE | Freq: Every day | ORAL | 1 refills | Status: DC

## 2021-02-27 NOTE — Patient Instructions (Signed)
HPV (Human Papillomavirus) Vaccine: What You Need to Know 1. Why get vaccinated? HPV (human papillomavirus) vaccine can prevent infection with some types of human papillomavirus. HPV infections can cause certain types of cancers, including:  cervical, vaginal, and vulvar cancers in women  penile cancer in men  anal cancers in both men and women  cancers of tonsils, base of tongue, and back of throat (oropharyngeal cancer) in both men and women HPV infections can also cause anogenital warts. HPV vaccine can prevent over 90% of cancers caused by HPV. HPV is spread through intimate skin-to-skin or sexual contact. HPV infections are so common that nearly all people will get at least one type of HPV at some time in their lives. Most HPV infections go away on their own within 2 years. But sometimes HPV infections will last longer and can cause cancers later in life. 2. HPV vaccine HPV vaccine is routinely recommended for adolescents at 11 or 24 years of age to ensure they are protected before they are exposed to the virus. HPV vaccine may be given beginning at age 9 years and vaccination is recommended for everyone through 24 years of age. HPV vaccine may be given to adults 27 through 24 years of age, based on discussions between the patient and health care provider. Most children who get the first dose before 15 years of age need 2 doses of HPV vaccine. People who get the first dose at or after 15 years of age and younger people with certain immunocompromising conditions need 3 doses. Your health care provider can give you more information. HPV vaccine may be given at the same time as other vaccines. 3. Talk with your health care provider Tell your vaccination provider if the person getting the vaccine:  Has had an allergic reaction after a previous dose of HPV vaccine, or has any severe, life-threatening allergies  Is pregnant--HPV vaccine is not recommended until after pregnancy In some cases,  your health care provider may decide to postpone HPV vaccination until a future visit. People with minor illnesses, such as a cold, may be vaccinated. People who are moderately or severely ill should usually wait until they recover before getting HPV vaccine. Your health care provider can give you more information. 4. Risks of a vaccine reaction  Soreness, redness, or swelling where the shot is given can happen after HPV vaccination.  Fever or headache can happen after HPV vaccination. People sometimes faint after medical procedures, including vaccination. Tell your provider if you feel dizzy or have vision changes or ringing in the ears. As with any medicine, there is a very remote chance of a vaccine causing a severe allergic reaction, other serious injury, or death. 5. What if there is a serious problem? An allergic reaction could occur after the vaccinated person leaves the clinic. If you see signs of a severe allergic reaction (hives, swelling of the face and throat, difficulty breathing, a fast heartbeat, dizziness, or weakness), call 9-1-1 and get the person to the nearest hospital. For other signs that concern you, call your health care provider. Adverse reactions should be reported to the Vaccine Adverse Event Reporting System (VAERS). Your health care provider will usually file this report, or you can do it yourself. Visit the VAERS website at www.vaers.hhs.gov or call 1-800-822-7967. VAERS is only for reporting reactions, and VAERS staff members do not give medical advice. 6. The National Vaccine Injury Compensation Program The National Vaccine Injury Compensation Program (VICP) is a federal program that was created   to compensate people who may have been injured by certain vaccines. Claims regarding alleged injury or death due to vaccination have a time limit for filing, which may be as short as two years. Visit the VICP website at www.hrsa.gov/vaccinecompensation or call 1-800-338-2382 to  learn about the program and about filing a claim. 7. How can I learn more?  Ask your health care provider.  Call your local or state health department.  Visit the website of the Food and Drug Administration (FDA) for vaccine package inserts and additional information at www.fda.gov/vaccines-blood-biologics/vaccines.  Contact the Centers for Disease Control and Prevention (CDC): ? Call 1-800-232-4636 (1-800-CDC-INFO) or ? Visit CDC's website at www.cdc.gov/vaccines. Vaccine Information Statement HPV Vaccine (05/20/2020) This information is not intended to replace advice given to you by your health care provider. Make sure you discuss any questions you have with your health care provider. Document Revised: 06/28/2020 Document Reviewed: 06/28/2020 Elsevier Patient Education  2021 Elsevier Inc.   

## 2021-02-27 NOTE — Progress Notes (Signed)
Subjective:    CC: hospital follow up  HPI: Pleasant 25 year old male presenting for hospital follow-up.  On 02/19/2021 he was seen at [facility] and diagnosed with pneumonia.  He was discharged from the ED with a 10-day course of doxycycline twice daily.  He has been taking this medication as prescribed without side effects.  He has approximately 3 doses left.  Notes his symptoms have improved and he is no longer having any shortness of breath, coughing, or congestion. No further fevers or chills.  Has returned to his regular daily activitiesHas returned to his regular daily activities.   Was seen in UC back in March for epigastric pain. Was prescribed Omeprazole 40mg  daily but never picked up the medication. Still having the intermittent pain and notes it feels like a hunger pain that is not relieved when he eats. Is interested in trying the medication now.    Ganglion cyst- Had the right wrist ganglion cyst drained and injected last year. It returned about 3 months later. He is interested in getting it drained again since it causes some discomfort with the work that he does.   Has gained around 40lbs over the past few months. Has been eating well and is not as active since he changed jobs.   Has been smoking approximately 1/2 ppd for several years. Not at a point that he is contemplating quitting and feels that it is just impossible. Unsure why he smokes but states that it is just an addiction at this point. Tried Nicotine gum in the past but it was not helpful. Has not tried any other cessation measures.   I reviewed the past medical history, family history, social history, surgical history, and allergies today and no changes were needed.  Please see the problem list section below in epic for further details.  Past Medical History: Past Medical History:  Diagnosis Date  . Ganglion cyst of dorsum of right wrist   . Mononucleosis    Past Surgical  History: History reviewed. No pertinent surgical history. Social History: Social History   Socioeconomic History  . Marital status: Single    Spouse name: Not on file  . Number of children: Not on file  . Years of education: Not on file  . Highest education level: Not on file  Occupational History  . Not on file  Tobacco Use  . Smoking status: Current Every Day Smoker    Packs/day: 0.50    Years: 9.00    Pack years: 4.50    Types: Cigarettes  . Smokeless tobacco: Former    Types: Neurosurgeon  . Vaping Use: Never used  Substance and Sexual Activity  . Alcohol use: Not Currently  . Drug use: Not Currently  . Sexual activity: Yes  Other Topics Concern  . Not on file  Social History Narrative  . Not on file   Social Determinants of Health   Financial Resource Strain: Not on file  Food Insecurity: Not on file  Transportation Needs: Not on file  Physical Activity: Not on file  Stress: Not on file  Social Connections: Not on file   Family History: Family History  Problem Relation Age of Onset  . Healthy Mother   . Healthy Father    Allergies: No Known Allergies Medications: See med rec.  Review of Systems: See HPI for pertinent positives and negatives.   Objective:    General: Well Developed, well nourished, and in no acute distress.  Neuro:  Alert and oriented x3.  HEENT: Normocephalic, atraumatic.  Skin: Warm and dry. Cardiac: Regular rate and rhythm, no murmurs rubs or gallops, no lower extremity edema.  Respiratory: Clear to auscultation bilaterally. Not using accessory muscles, speaking in full sentences.   Impression and Recommendations:    1. Hospital discharge follow-up Responding well to Doxycycline and symptoms improved. Complete 10 day course as prescribed. Return for worsening or returning symptoms.  2. Encounter for tobacco use cessation counseling Discussed the risks of smoking and the benefits of quitting. Reviewed the options for  smoking cessation. Recommend nicotine patches. Patient will consider and let me know if he is ready to try quitting again.   3. Class 1 obesity due to excess calories without serious comorbidity with body mass index (BMI) of 31.0 to 31.9 in adult Discussed the importance of maintaining a healthy weight. Recommend regular exercise, weight loss, and following a well balanced healthy diet.   4. Ganglion cyst of dorsum of right wrist Recommend following up with Dr. Karie Schwalbe for this.   5. Need for HPV vaccination Discussed the HPV vaccine and the protection it provides. He is unsure today so vaccine information added to AVS for his review.   Return if symptoms worsen or fail to improve. __________________________________________ [name], DNP, APRN, FNP-BC Primary Care and Sports Medicine [facility]

## 2021-02-28 ENCOUNTER — Ambulatory Visit (INDEPENDENT_AMBULATORY_CARE_PROVIDER_SITE_OTHER): Payer: 59

## 2021-02-28 ENCOUNTER — Ambulatory Visit (INDEPENDENT_AMBULATORY_CARE_PROVIDER_SITE_OTHER): Payer: 59 | Admitting: Sports Medicine

## 2021-02-28 DIAGNOSIS — M67431 Ganglion, right wrist: Secondary | ICD-10-CM

## 2021-02-28 NOTE — Progress Notes (Signed)
    Procedures performed today:    Procedure: Real-time Ultrasound Guided injection of the dorsal wrist ganglion on the right Device: Samsung HS60  Verbal informed consent obtained.  Time-out conducted.  Noted no overlying erythema, induration, or other signs of local infection.  Skin prepped in a sterile fashion.  Local anesthesia: Topical Ethyl chloride.  With sterile technique and under real time ultrasound guidance:  Using a 25-gauge needle advanced into the dorsal wrist ganglion, I fenestrated it with multiple passes and then injected 1/2 cc kenalog 40, 1/2 cc lidocaine.   Completed without difficulty  Advised to call if fevers/chills, erythema, induration, drainage, or persistent bleeding.  Images permanently stored and available for review in PACS.  Impression: Technically successful ultrasound guided injection.  Independent interpretation of notes and tests performed by another provider:   None.  Brief History, Exam, Impression, and Recommendations:    Ganglion cyst of dorsum of right wrist This is a pleasant 25 year old male, he is having a recurrence of a right wrist dorsal ganglion cyst. We did an aspiration and injection about 13 months ago and he did well, he tells me about 3 months after the procedure he did have a recurrence. Is really not hurting but it is protuberant and he would like the procedure performed again. The cyst is fairly small today and we just performed an injection and fenestration. I would like to see him back on an as-needed basis however if this recurs we will probably get him referred off to hand surgery. This is an exacerbation of a chronic process.    ___________________________________________ Ihor Austin. Benjamin Stain, M.D., ABFM., CAQSM. Primary Care and Sports Medicine [facility]  Adjunct Instructor of Family Medicine  [employer]

## 2021-02-28 NOTE — Assessment & Plan Note (Addendum)
This is a pleasant 25 year old male, he is having a recurrence of a right wrist dorsal ganglion cyst. We did an aspiration and injection about 13 months ago and he did well, he tells me about 3 months after the procedure he did have a recurrence. Is really not hurting but it is protuberant and he would like the procedure performed again. The cyst is fairly small today and we just performed an injection and fenestration. I would like to see him back on an as-needed basis however if this recurs we will probably get him referred off to hand surgery. This is an exacerbation of a chronic process.

## 2021-04-19 ENCOUNTER — Other Ambulatory Visit: Payer: Self-pay

## 2021-04-19 ENCOUNTER — Encounter: Payer: Self-pay | Admitting: Emergency Medicine

## 2021-04-19 ENCOUNTER — Emergency Department
Admission: EM | Admit: 2021-04-19 | Discharge: 2021-04-19 | Disposition: A | Discharge status: Home / Self Care | Payer: Self-pay | Source: Home / Self Care

## 2021-04-19 DIAGNOSIS — J3489 Other specified disorders of nose and nasal sinuses: Secondary | ICD-10-CM

## 2021-04-19 DIAGNOSIS — J029 Acute pharyngitis, unspecified: Secondary | ICD-10-CM

## 2021-04-19 DIAGNOSIS — J0100 Acute maxillary sinusitis, unspecified: Secondary | ICD-10-CM

## 2021-04-19 MED ORDER — AMOXICILLIN-POT CLAVULANATE 875-125 MG PO TABS: 1.0000 | ORAL_TABLET | Freq: Two times a day (BID) | ORAL | 0 refills | Status: AC

## 2021-04-19 MED ORDER — PREDNISONE 20 MG PO TABS: ORAL_TABLET | ORAL | 0 refills | Status: DC

## 2021-04-19 NOTE — ED Provider Notes (Addendum)
[name]    CSN: [id number] Arrival date & time: 04/19/21  1210      History   Chief Complaint Chief Complaint  Patient presents with  . Sore Throat    HPI Seth Torres is a 25 y.o. male.   HPI 25 year old presents with sore throat, cough, congestion for 5 days reports just returning from [place] and woke up with fever and body aches.  Patient currently unvaccinated for Covid-19.  Reports pneumonia on 02/19/2021.  Past Medical History:  Diagnosis Date  . Ganglion cyst of dorsum of right wrist   . Mononucleosis     Patient Active Problem List   Diagnosis Date Noted  . Class 1 obesity due to excess calories without serious comorbidity with body mass index (BMI) of 31.0 to 31.9 in adult 02/27/2021  . Need for HPV vaccination 02/27/2021  . Recurrent infections 11/13/2018  . History of infectious mononucleosis 11/13/2018  . Bilateral impacted cerumen 05/18/2018  . 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 Acute tonsillitis due to infectious mononucleosis 01/06/2018  . Cardiac murmur 01/02/2018  . 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 Closed nondisplaced avulsion fracture of left talus 03/07/2017  . Ganglion cyst of dorsum of right wrist 11/29/2016    History reviewed. No pertinent surgical history.     Home Medications    Prior to Admission medications   Medication Sig Start Date End Date Taking? Authorizing Provider  amoxicillin-clavulanate (AUGMENTIN) 875-125 MG tablet Take 1 tablet by mouth 2 (two) times daily for 5 days. 04/19/21 04/24/21 Yes Trevor Iha, FNP  predniSONE (DELTASONE) 20 MG tablet Take 3 tabs PO daily x 5 days. 04/19/21  Yes Trevor Iha, FNP    Family History Family History  Problem Relation Age of Onset  . Healthy Mother   . Healthy Father     Social History Social History   Tobacco Use  . Smoking status: Every Day    Packs/day: 0.50    Years: 9.00    Pack years: 4.50    Types: Cigarettes  . Smokeless tobacco: Former     Types: Engineer, drilling  . Vaping Use: Never used  Substance Use Topics  . Alcohol use: Yes  . Drug use: Not Currently     Allergies   Patient has no known allergies.   Review of Systems Review of Systems  Constitutional:  Positive for fever.  HENT:  Positive for congestion and sore throat.   Respiratory:  Positive for cough.   All other systems reviewed and are negative.   Physical Exam Triage Vital Signs ED Triage Vitals  Enc Vitals Group     BP 04/19/21 1235 137/81     Pulse Rate 04/19/21 1235 85     Resp 04/19/21 1235 18     Temp 04/19/21 1235 98.5 F (36.9 C)     Temp Source 04/19/21 1235 Oral     SpO2 04/19/21 1235 98 %     Weight 04/19/21 1236 220 lb (99.8 kg)     Height 04/19/21 1236 5\' 9"  (1.753 m)     Head Circumference --      Peak Flow --      Pain Score 04/19/21 1236 2     Pain Loc --      Pain Edu? --      Excl. in GC? --    No data found.  Updated Vital Signs BP 137/81 (BP Location: Left Arm)   Pulse 85   Temp 98.5 F (36.9  C) (Oral)   Resp 18   Ht 5\' 9"  (1.753 m)   Wt 220 lb (99.8 kg)   SpO2 98%   BMI 32.49 kg/m      Physical Exam Constitutional:      General: He is not in acute distress.    Appearance: He is well-developed. He is ill-appearing and diaphoretic. He is not toxic-appearing.  HENT:     Head: Normocephalic and atraumatic.     Right Ear: Tympanic membrane and ear canal normal.     Nose:     Right Sinus: Maxillary sinus tenderness present.     Left Sinus: Maxillary sinus tenderness present.     Mouth/Throat:     Lips: Pink.     Mouth: Mucous membranes are moist.     Pharynx: Oropharynx is clear. Uvula midline. Posterior oropharyngeal erythema present. No pharyngeal swelling or oropharyngeal exudate.     Tonsils: No tonsillar exudate or tonsillar abscesses.  Cardiovascular:     Rate and Rhythm: Normal rate and regular rhythm.     Pulses: Normal pulses.     Heart sounds: Normal heart sounds.  Pulmonary:      Effort: Pulmonary effort is normal. No respiratory distress.     Breath sounds: Stridor present. Rhonchi present. No wheezing or rales.  Musculoskeletal:        General: Normal range of motion.     Cervical back: Normal range of motion and neck supple. Tenderness present.  Lymphadenopathy:     Cervical: Cervical adenopathy present.  Skin:    General: Skin is warm.  Neurological:     General: No focal deficit present.     Mental Status: He is alert and oriented to person, place, and time.  Psychiatric:        Mood and Affect: Mood normal.        Behavior: Behavior normal.     UC Treatments / Results  Labs (all labs ordered are listed, but only abnormal results are displayed) Labs Reviewed - No data to display  EKG   Radiology No results found.  Procedures Procedures (including critical care time)  Medications Ordered in UC Medications - No data to display  Initial Impression / Assessment and Plan / UC Course  I have reviewed the triage vital signs and the nursing notes.  Pertinent labs & imaging results that were available during my care of the patient were reviewed by me and considered in my medical decision making (see chart for details).    MDM: 1.  Pharyngitis-advised OTC ibuprofen 800 mg 1-2 times daily, as needed, 2.  Subacute maxillary sinusitis-Rx'd Augmentin, 3.  Sinus pressure-Rx'd Prednisone.  Patient discharged home, hemodynamically stable.  Work note provided per patient request. Final Clinical Impressions(s) / UC Diagnoses   Final diagnoses:  Pharyngitis, unspecified etiology  Subacute maxillary sinusitis  Sinus pressure     Discharge Instructions      Advised patient to take medication as directed with food to completion.  Encourage patient increase daily water intake while taking these medications.     ED Prescriptions     Medication Sig Dispense Auth. Provider   amoxicillin-clavulanate (AUGMENTIN) 875-125 MG tablet Take 1 tablet by mouth 2  (two) times daily for 5 days. 10 tablet , FNP   predniSONE (DELTASONE) 20 MG tablet Take 3 tabs PO daily x 5 days. 15 tablet Trevor Iha, FNP      PDMP not reviewed this encounter.   Trevor Iha, FNP 04/19/21 1332  Trevor Iha, FNP 04/19/21 1337

## 2021-04-19 NOTE — ED Triage Notes (Signed)
Sore throat, cough, congestion, just returned from [place] 5 days ago woke up with fever, body aches. Unvaccinated.

## 2021-04-19 NOTE — Discharge Instructions (Addendum)
Advised patient to take medication as directed with food to completion.  Encourage patient increase daily water intake while taking these medications. 

## 2021-04-24 ENCOUNTER — Encounter: Payer: Self-pay | Admitting: Medical-Surgical

## 2021-04-24 ENCOUNTER — Telehealth (INDEPENDENT_AMBULATORY_CARE_PROVIDER_SITE_OTHER): Payer: 59 | Admitting: Medical-Surgical

## 2021-04-24 DIAGNOSIS — U071 COVID-19: Secondary | ICD-10-CM | POA: Diagnosis not present

## 2021-04-24 NOTE — Progress Notes (Signed)
Virtual Visit via Video Note  I connected with Seth Torres on 04/24/21 at  4:00 PM EDT by a video enabled telemedicine application and verified that I am speaking with the correct person using two identifiers.   I discussed the limitations of evaluation and management by telemedicine and the availability of in person appointments. The patient expressed understanding and agreed to proceed.  Patient location: home Provider locations: office  Subjective:    CC: viral illness  HPI: Pleasant 25 year old male presenting via MyChart video visit to discuss recent UC visit for pharngitis and sinusitis. He was seen on 7/6 for symptoms that had started on 7/2. He was treated with Augmentin and prednisone which he has completed. He was written out of work until today. He was not tested for COVID during his appointment with UC. Reports his girlfriend tested positive for COVID a few days ago. He decided to test today and got a positive results as well. He does not have any symptoms aside from everything tasting bitter. Reports feeling fine otherwise. Needs a note releasing him to work without restrictions when he is able to go back.   Past medical history, Surgical history, Family history not pertinant except as noted below, Social history, Allergies, and medications have been entered into the medical record, reviewed, and corrections made.   Review of Systems: See HPI for pertinent positives and negatives.   Objective:    General: Speaking clearly in complete sentences without any shortness of breath.  Alert and oriented x3.  Normal judgment. No apparent acute distress.  Impression and Recommendations:    1. COVID-19 virus infection Unclear start date for symptoms of COVID. Suspect he had COVID to start with but no test results available to confirm. Because of his positive test today, recommend quarantine per CDC protocols. He will be able to return to work on 7/16 (Saturday) where he will need to  wear a mask around others for another 5 days. Work note sent through [employer].   I discussed the assessment and treatment plan with the patient. The patient was provided an opportunity to ask questions and all were answered. The patient agreed with the plan and demonstrated an understanding of the instructions.   The patient was advised to call back or seek an in-person evaluation if the symptoms worsen or if the condition fails to improve as anticipated.  20 minutes of non-face-to-face time was provided during this encounter.  Return if symptoms worsen or fail to improve.  [name], DNP, APRN, FNP-BC [facility] and Sports Medicine

## 2021-11-21 ENCOUNTER — Ambulatory Visit (INDEPENDENT_AMBULATORY_CARE_PROVIDER_SITE_OTHER): Payer: Commercial Managed Care - PPO | Admitting: Medical-Surgical

## 2021-11-21 ENCOUNTER — Telehealth: Payer: Self-pay | Admitting: Medical-Surgical

## 2021-11-21 ENCOUNTER — Encounter: Payer: Self-pay | Admitting: Medical-Surgical

## 2021-11-21 VITALS — BP 114/76 | HR 74 | Resp 20 | Ht 69.0 in | Wt 237.0 lb

## 2021-11-21 DIAGNOSIS — R1013 Epigastric pain: Secondary | ICD-10-CM

## 2021-11-21 MED ORDER — PANTOPRAZOLE SODIUM 40 MG PO TBEC: 40.0000 mg | DELAYED_RELEASE_TABLET | Freq: Every day | ORAL | 3 refills | Status: DC

## 2021-11-21 NOTE — Telephone Encounter (Signed)
Please contact patient.  His appointment with Dr. Karie Schwalbe for 2/8 was rescheduled.  He needs to come in fasting for at least 1 hour tomorrow to have his H. pylori breath test completed so he can go ahead and get started on the Protonix that was sent to the pharmacy.  ___________________________________________ [name], DNP, APRN, FNP-BC Primary Care and Sports Medicine [facility] [place]

## 2021-11-21 NOTE — Progress Notes (Signed)
°  HPI with pertinent ROS:   CC: Heartburn  HPI: Pleasant 26 year old male presenting today for evaluation of continued heartburn.Has been a long-term problem and has been getting worse.  Now his heartburn and acid reflux occurs every day.  It varies in severity but he has not had any days in the last few weeks where he has not had the symptoms.  Notes that he did have a GI bug a few weeks ago that was pretty bad but this is since resolved.  Diet consist mainly of junk food and fast food.  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  Notes that eating oranges and pineapples make it worse as does increased activity.  Milk and sometimes different sodas make it better.  Flavored water packets are hit or miss with some not being bothersome and others worsening of symptoms.  He has tried Tums which do help temporarily but a couple of hours later his symptoms have returned.  He was previously evaluated and prescribed a medication for this however he never picked it up from the pharmacy.  He has not tried any other over-the-counter medications.  Denies fever, chills, nausea, vomiting, diarrhea, constipation, hematochezia, and melena.  I reviewed the past medical history, family history, social history, surgical history, and allergies today and no changes were needed.  Please see the problem list section below in epic for further details.   Physical exam:   General: Well Developed, well nourished, and in no acute distress.  Neuro: Alert and oriented x3.  HEENT: Normocephalic, atraumatic.  Skin: Warm and dry. Cardiac: Regular rate and rhythm, no murmurs rubs or gallops, no lower extremity edema.  Respiratory: Clear to auscultation bilaterally. Not using accessory muscles, speaking in full sentences. Abdomen: Soft, nontender, nondistended. Bowel sounds + x 4 quadrants. No HSM appreciated.  Impression and Recommendations:    1. Epigastric pain Plenity has not been taking  any PPIs, would like to go ahead and get an H. pylori breath test today.  Unfortunately he had not been n.p.o. for the last hour or so.  He does have to come back for an appointment tomorrow and reports that he will be able to come to that appointment fasting so that he can get this test completed.  I will go ahead and send in Protonix 40 mg daily but I have advised him to avoid taking this medication as it will void the testing that is requested tomorrow.  Discussed various dietary modifications as well as lifestyle changes that would benefit his reflux symptoms.  He has gained quite a bit of weight in the last year which is likely not helping his symptoms and would benefit from intentional exercise with a goal for weight loss. - H. pylori breath test  Return for Appointment with Dr. Karie Schwalbe as scheduled tomorrow. ___________________________________________ [name], DNP, APRN, FNP-BC Primary Care and Sports Medicine [facility]

## 2021-11-22 ENCOUNTER — Ambulatory Visit: Payer: 59 | Admitting: Sports Medicine

## 2021-11-23 LAB — H. PYLORI BREATH TEST: H. pylori Breath Test: NOT DETECTED

## 2021-11-30 ENCOUNTER — Ambulatory Visit: Payer: Commercial Managed Care - PPO | Admitting: Sports Medicine

## 2021-12-01 ENCOUNTER — Ambulatory Visit: Payer: Commercial Managed Care - PPO | Admitting: Sports Medicine

## 2021-12-01 ENCOUNTER — Other Ambulatory Visit: Payer: Self-pay

## 2021-12-01 DIAGNOSIS — F17200 Nicotine dependence, unspecified, uncomplicated: Secondary | ICD-10-CM | POA: Diagnosis not present

## 2021-12-01 DIAGNOSIS — M67431 Ganglion, right wrist: Secondary | ICD-10-CM | POA: Diagnosis not present

## 2021-12-01 NOTE — Progress Notes (Signed)
° ° °  Procedures performed today:    None.  Independent interpretation of notes and tests performed by another provider:   None.  Brief History, Exam, Impression, and Recommendations:    Ganglion cyst of dorsum of right wrist Seth Torres is a 26 year old male, he has a right wrist dorsal ganglion cyst, we have done 3 aspirations and injections with ultrasound guidance with recurrence each time. At this point I would like hand surgery to weigh in for discussion of surgical intervention.  Smoker We discussed the portance of smoking cessation with regards to surgical complications and wound healing. I did mention Chantix, he will look into this and discuss it with his PCP.    ___________________________________________ Gwen Her. Dianah Field, M.D., ABFM., CAQSM. Primary Care and [place] Instructor of [place] [facility]

## 2021-12-01 NOTE — Assessment & Plan Note (Signed)
Seth Torres is a 26 year old male, he has a right wrist dorsal ganglion cyst, we have done 3 aspirations and injections with ultrasound guidance with recurrence each time. At this point I would like hand surgery to weigh in for discussion of surgical intervention.

## 2021-12-01 NOTE — Assessment & Plan Note (Signed)
We discussed the portance of smoking cessation with regards to surgical complications and wound healing. I did mention Chantix, he will look into this and discuss it with his PCP.

## 2022-03-24 ENCOUNTER — Other Ambulatory Visit: Payer: Self-pay | Admitting: Medical-Surgical

## 2022-04-23 ENCOUNTER — Other Ambulatory Visit: Payer: Self-pay | Admitting: Medical-Surgical

## 2022-05-30 ENCOUNTER — Other Ambulatory Visit: Payer: Self-pay | Admitting: Medical-Surgical

## 2022-05-30 NOTE — Telephone Encounter (Signed)
Patient needs appointment for further refills.  Last Office visit 11/21/2021  Last filled 04/25/2022  Sent 15 tablets to the pharmacy. No Refills.

## 2022-05-30 NOTE — Telephone Encounter (Signed)
Patient has been scheduled for 06/19/22. AMUCK 

## 2022-06-19 ENCOUNTER — Encounter: Payer: Self-pay | Admitting: Medical-Surgical

## 2022-06-19 ENCOUNTER — Ambulatory Visit (INDEPENDENT_AMBULATORY_CARE_PROVIDER_SITE_OTHER): Payer: BLUE CROSS/BLUE SHIELD

## 2022-06-19 ENCOUNTER — Other Ambulatory Visit: Payer: Self-pay | Admitting: Medical-Surgical

## 2022-06-19 ENCOUNTER — Ambulatory Visit (INDEPENDENT_AMBULATORY_CARE_PROVIDER_SITE_OTHER): Payer: BLUE CROSS/BLUE SHIELD | Admitting: Medical-Surgical

## 2022-06-19 VITALS — BP 120/72 | HR 67 | Resp 20 | Ht 69.0 in | Wt 227.9 lb

## 2022-06-19 DIAGNOSIS — M25561 Pain in right knee: Secondary | ICD-10-CM

## 2022-06-19 DIAGNOSIS — K219 Gastro-esophageal reflux disease without esophagitis: Secondary | ICD-10-CM

## 2022-06-19 DIAGNOSIS — M25571 Pain in right ankle and joints of right foot: Secondary | ICD-10-CM | POA: Diagnosis not present

## 2022-06-19 DIAGNOSIS — M25572 Pain in left ankle and joints of left foot: Secondary | ICD-10-CM

## 2022-06-19 DIAGNOSIS — M25562 Pain in left knee: Secondary | ICD-10-CM | POA: Diagnosis not present

## 2022-06-19 MED ORDER — PANTOPRAZOLE SODIUM 20 MG PO TBEC: 20.0000 mg | DELAYED_RELEASE_TABLET | Freq: Every day | ORAL | 0 refills | Status: DC | PRN

## 2022-06-19 NOTE — Progress Notes (Unsigned)
   Established Patient Office Visit  Subjective   Patient ID: Seth Torres, male   DOB: 1995-11-09 Age: 26 y.o. MRN: [id number]   Chief Complaint  Patient presents with   Medication Refill   HPI Pleasant 26 year old male presenting today for follow-up on GERD.    Objective:    Vitals:   06/19/22 1549  BP: 120/72  Pulse: 67  Resp: 20  Height: 5\' 9"  (1.753 m)  Weight: 227 lb 14.4 oz (103.4 kg)  SpO2: 98%  BMI (Calculated): 33.64    Physical Exam   No results found for this or any previous visit (from the past 24 hour(s)).   {Labs (Optional):23779}  The ASCVD Risk score (Arnett DK, et al., 2019) failed to calculate for the following reasons:   The 2019 ASCVD risk score is only valid for ages 75 to 33   Assessment & Plan:   No problem-specific Assessment & Plan notes found for this encounter.   No follow-ups on file.  ___________________________________________ 77, DNP, APRN, FNP-BC Primary Care and Sports Medicine [facility] [place]

## 2022-06-27 ENCOUNTER — Encounter: Payer: Self-pay | Admitting: Medical-Surgical

## 2022-09-13 ENCOUNTER — Other Ambulatory Visit: Payer: Self-pay | Admitting: Medical-Surgical

## 2022-09-14 ENCOUNTER — Encounter: Payer: Self-pay | Admitting: Sports Medicine

## 2022-09-14 ENCOUNTER — Ambulatory Visit (INDEPENDENT_AMBULATORY_CARE_PROVIDER_SITE_OTHER): Payer: Self-pay

## 2022-09-14 ENCOUNTER — Ambulatory Visit: Payer: BLUE CROSS/BLUE SHIELD | Admitting: Sports Medicine

## 2022-09-14 VITALS — BP 139/71 | HR 74 | Ht 69.0 in | Wt 240.0 lb

## 2022-09-14 DIAGNOSIS — M67431 Ganglion, right wrist: Secondary | ICD-10-CM

## 2022-09-14 MED ORDER — TRIAMCINOLONE ACETONIDE 40 MG/ML IJ SUSP
20.0000 mg | Freq: Once | INTRAMUSCULAR | Status: AC
  Administered 2022-09-14: 20 mg via INTRAMUSCULAR

## 2022-09-14 MED ORDER — MELOXICAM 15 MG PO TABS: ORAL_TABLET | ORAL | 3 refills | Status: DC

## 2022-09-14 NOTE — Addendum Note (Signed)
Addended by: Monica Becton on: 09/14/2022 01:57 PM   Modules accepted: Orders

## 2022-09-14 NOTE — Progress Notes (Signed)
    Procedures performed today:    Procedure: Real-time Ultrasound Guided aspiration/injection of right wrist dorsal ganglion cyst Device: Samsung HS60  Verbal informed consent obtained.  Time-out conducted.  Noted no overlying erythema, induration, or other signs of local infection.  Skin prepped in a sterile fashion.  Local anesthesia: Topical Ethyl chloride.  With sterile technique and under real time ultrasound guidance: Noted small dorsal ganglion, aspirated scant thick fluid, syringe switched and 0.5 cc kenalog 40, 0.5 cc lidocaine injected easily. Completed without difficulty  Advised to call if fevers/chills, erythema, induration, drainage, or persistent bleeding.  Images permanently stored and available for review in PACS.  Impression: Technically successful ultrasound guided aspiration/injection.  Independent interpretation of notes and tests performed by another provider:   None.  Brief History, Exam, Impression, and Recommendations:    Ganglion cyst of dorsum of right wrist This is a pleasant 26 year old male, I saw him earlier this year with a dorsal wrist ganglion cyst, we did 3 aspirations and injections, he continued to have recurrences so we referred him to orthopedic surgery, he did have a surgical ganglion cyst excision back in the summertime, unfortunately he is now having a recurrence in the same location. He would like an aspiration and injection today so this was performed, I will also refer him back to hand surgery for further discussion.    ____________________________________________ Ihor Austin. Benjamin Stain, M.D., ABFM., CAQSM., AME. Primary Care and Sports Medicine [facility]  Adjunct Professor of Family Medicine  [place] [employer] [facility] [employer]  Restaurant manager, fast food

## 2022-09-14 NOTE — Addendum Note (Signed)
Addended by: Carren Rang A on: 09/14/2022 02:14 PM   Modules accepted: Orders

## 2022-09-14 NOTE — Assessment & Plan Note (Signed)
This is a pleasant 26 year old male, I saw him earlier this year with a dorsal wrist ganglion cyst, we did 3 aspirations and injections, he continued to have recurrences so we referred him to orthopedic surgery, he did have a surgical ganglion cyst excision back in the summertime, unfortunately he is now having a recurrence in the same location. He would like an aspiration and injection today so this was performed, I will also refer him back to hand surgery for further discussion.

## 2022-10-12 ENCOUNTER — Ambulatory Visit: Payer: Self-pay | Admitting: Sports Medicine

## 2022-12-18 ENCOUNTER — Ambulatory Visit: Payer: Self-pay | Admitting: Medical-Surgical

## 2023-01-15 DIAGNOSIS — K219 Gastro-esophageal reflux disease without esophagitis: Secondary | ICD-10-CM | POA: Insufficient documentation

## 2023-01-15 NOTE — Progress Notes (Unsigned)
        Established patient visit  History, exam, impression, and plan:  No problem-specific Assessment & Plan notes found for this encounter.   Procedures performed this visit: None.  No follow-ups on file.  __________________________________ Clearnce Sorrel, DNP, APRN, FNP-BC Primary Care and [place]

## 2023-01-16 ENCOUNTER — Ambulatory Visit (INDEPENDENT_AMBULATORY_CARE_PROVIDER_SITE_OTHER): Payer: BLUE CROSS/BLUE SHIELD | Admitting: Medical-Surgical

## 2023-01-16 DIAGNOSIS — Z91199 Patient's noncompliance with other medical treatment and regimen due to unspecified reason: Secondary | ICD-10-CM

## 2023-01-16 DIAGNOSIS — K219 Gastro-esophageal reflux disease without esophagitis: Secondary | ICD-10-CM

## 2023-03-08 ENCOUNTER — Telehealth (INDEPENDENT_AMBULATORY_CARE_PROVIDER_SITE_OTHER): Payer: BLUE CROSS/BLUE SHIELD | Admitting: Medical-Surgical

## 2023-03-08 DIAGNOSIS — A084 Viral intestinal infection, unspecified: Secondary | ICD-10-CM | POA: Diagnosis not present

## 2023-03-08 NOTE — Progress Notes (Signed)
Virtual Visit via Video Note  I connected with Seth Torres on 03/08/23 at  4:00 PM EDT by a video enabled telemedicine application and verified that I am speaking with the correct person using two identifiers.   I discussed the limitations of evaluation and management by telemedicine and the availability of in person appointments. The patient expressed understanding and agreed to proceed.  Patient location: home Provider locations: office  Subjective:    CC: GI symptoms  HPI: Pleasant 27 year old male presenting via MyChart video visit with reports of approximately 24 hours of subjective fevers, chills, nausea, poor appetite, and diarrhea.  Has had no vomiting, melena, or hematochezia.  Able to hold down fluids.  Has been eating small amounts of bland foods.  Notes that a couple of family members had a GI bug recently.  Missed work today and is requesting a work note.   Past medical history, Surgical history, Family history not pertinant except as noted below, Social history, Allergies, and medications have been entered into the medical record, reviewed, and corrections made.   Review of Systems: See HPI for pertinent positives and negatives.   Objective:    General: Speaking clearly in complete sentences without any shortness of breath.  Alert and oriented x3.  Normal judgment. No apparent acute distress.  Impression and Recommendations:    1. Viral gastroenteritis Symptoms consistent with viral gastroenteritis.  Recommend pushing fluids with a bland diet advance as tolerated.  Antiemetic such as Zofran offered, patient declined.  Okay to use Tylenol for fever/discomfort.  Recommend avoiding antidiarrheal medications.  Work note sent through [employer].  I discussed the assessment and treatment plan with the patient. The patient was provided an opportunity to ask questions and all were answered. The patient agreed with the plan and demonstrated an understanding of the instructions.    The patient was advised to call back or seek an in-person evaluation if the symptoms worsen or if the condition fails to improve as anticipated.  20 minutes of non-face-to-face time was provided during this encounter.  Return if symptoms worsen or fail to improve.  [name], DNP, APRN, FNP-BC [facility] and Sports Medicine

## 2023-05-27 ENCOUNTER — Encounter: Payer: Self-pay | Admitting: Medical-Surgical

## 2023-05-27 ENCOUNTER — Telehealth: Payer: BLUE CROSS/BLUE SHIELD | Admitting: Medical-Surgical

## 2023-05-27 DIAGNOSIS — U071 COVID-19: Secondary | ICD-10-CM

## 2023-05-27 MED ORDER — HYDROCOD POLI-CHLORPHE POLI ER 10-8 MG/5ML PO SUER: 5.0000 mL | Freq: Two times a day (BID) | ORAL | 0 refills | Status: DC | PRN

## 2023-05-27 MED ORDER — DEXAMETHASONE 4 MG PO TABS
4.0000 mg | ORAL_TABLET | Freq: Two times a day (BID) | ORAL | 0 refills | Status: DC
Start: 2023-05-27 — End: 2023-11-08

## 2023-05-27 NOTE — Progress Notes (Signed)
Virtual Visit via Video Note  I connected with Seth Torres on 05/27/23 at  1:00 PM EDT by a video enabled telemedicine application and verified that I am speaking with the correct person using two identifiers.   I discussed the limitations of evaluation and management by telemedicine and the availability of in person appointments. The patient expressed understanding and agreed to proceed.  Patient location: home Provider locations: office  Subjective:    CC: COVID-19 positive  HPI: Pleasant 27 year old male presenting via MyChart video visit with reports of testing positive for COVID-19 on 05/24/2023.  Symptoms started with bodyaches, sore throat, sinus congestion, cough, and fever Tmax 100 on the evening of 8/8.  Has had additionally had some night sweats as well as diarrhea.  Treating at home with ibuprofen and NyQuil with temporary relief.  Has been out of work since 05/24/2023.  Denies nausea, vomiting, shortness of breath, and chest pain.   Past medical history, Surgical history, Family history not pertinant except as noted below, Social history, Allergies, and medications have been entered into the medical record, reviewed, and corrections made.   Review of Systems: See HPI for pertinent positives and negatives.   Objective:    General: Speaking clearly in complete sentences without any shortness of breath.  Alert and oriented x3.  Normal judgment. No apparent acute distress.  Impression and Recommendations:    1. COVID-19 virus infection Discussed optional treatment with Paxlovid.  He is at the very end of the 5-day timeframe.  After discussion of possible side effects and expectations, he would like to defer Paxlovid today.  Adding Decadron 4 mg twice daily x 5 days.  Also adding Tussionex for cough management.  Okay to continue over-the-counter cold/flu preparations if desired.  Work note provided via Clinical cytogeneticist.  I discussed the assessment and treatment plan with the patient. The  patient was provided an opportunity to ask questions and all were answered. The patient agreed with the plan and demonstrated an understanding of the instructions.   The patient was advised to call back or seek an in-person evaluation if the symptoms worsen or if the condition fails to improve as anticipated.  25 minutes of non-face-to-face time was provided during this encounter.  Return if symptoms worsen or fail to improve.  [name], DNP, APRN, FNP-BC [facility] and Sports Medicine

## 2023-11-07 ENCOUNTER — Ambulatory Visit: Payer: BLUE CROSS/BLUE SHIELD | Admitting: Sports Medicine

## 2023-11-08 ENCOUNTER — Ambulatory Visit (INDEPENDENT_AMBULATORY_CARE_PROVIDER_SITE_OTHER): Payer: BLUE CROSS/BLUE SHIELD | Admitting: Medical-Surgical

## 2023-11-08 ENCOUNTER — Encounter: Payer: Self-pay | Admitting: Medical-Surgical

## 2023-11-08 VITALS — BP 101/69 | HR 55 | Resp 20 | Ht 69.0 in | Wt 232.2 lb

## 2023-11-08 DIAGNOSIS — Z6834 Body mass index (BMI) 34.0-34.9, adult: Secondary | ICD-10-CM

## 2023-11-08 DIAGNOSIS — E66811 Obesity, class 1: Secondary | ICD-10-CM

## 2023-11-08 DIAGNOSIS — Z0000 Encounter for general adult medical examination without abnormal findings: Secondary | ICD-10-CM

## 2023-11-08 DIAGNOSIS — Z23 Encounter for immunization: Secondary | ICD-10-CM | POA: Diagnosis not present

## 2023-11-08 DIAGNOSIS — E6609 Other obesity due to excess calories: Secondary | ICD-10-CM | POA: Diagnosis not present

## 2023-11-08 DIAGNOSIS — K219 Gastro-esophageal reflux disease without esophagitis: Secondary | ICD-10-CM

## 2023-11-08 MED ORDER — PANTOPRAZOLE SODIUM 40 MG PO TBEC: 40.0000 mg | DELAYED_RELEASE_TABLET | Freq: Every day | ORAL | 3 refills | Status: DC

## 2023-11-08 MED ORDER — FAMOTIDINE 20 MG PO TABS: 20.0000 mg | ORAL_TABLET | Freq: Two times a day (BID) | ORAL | 1 refills | Status: DC

## 2023-11-08 NOTE — Progress Notes (Unsigned)
        Established patient visit  History, exam, impression, and plan:  1. Gastroesophageal reflux disease without esophagitis (Primary) Pleasant 28 year old with a history of GERD presenting today for follow-up.  He has not been taking any reflux medicines recently but notes that his symptoms have returned.  He has burning in the chest as well as in the epigastric area.  Previously was able to quit smoking and his GERD went away.  He started back and is using Zyn pouches.  Since then his reflux has increased.  He does drink alcohol only on occasion.  Prefers liquor and notes that when he drinks, there are times when he will drink a whole bottle.  Endorses eating acidic foods on a regular basis.  No excessive bloating, diarrhea, constipation, vomiting, fever, chills, melena, or hematochezia.  Abdominal exam unremarkable. discussed the importance of avoiding trigger foods including nicotine, caffeine, alcohol, and acidic foods.  Plan to restart pantoprazole 40 mg daily over the next 4 weeks with a plan to wean down to the lowest effective dose.  Okay to use famotidine 20 mg twice daily as needed for breakthrough symptoms.  2. Healthcare maintenance Checking CBC with differential and CMP today. - CBC with Differential/Platelet - CMP14+EGFR  3. Class 1 obesity due to excess calories without serious comorbidity with body mass index (BMI) of 34.0 to 34.9 in adult Checking lipids today.  Discussed recommendations for weight loss to help with GERD.  Would like to see him eating a heart healthy diet that is low in fat as well as getting regular intentional exercise with a goal for weight loss. - Lipid panel  4. Need for HPV vaccination HPV #1 given in office today.  Please schedule HPV 2 and 3 as nurse visits per checkout instructions. - HPV 9-valent vaccine,Recombinat  Procedures performed this visit: None.  Return if symptoms worsen or fail to improve.  __________________________________ [name], DNP, APRN, FNP-BC Primary Care and Sports Medicine [facility] [place]

## 2023-11-09 ENCOUNTER — Encounter: Payer: Self-pay | Admitting: Medical-Surgical

## 2023-11-09 LAB — CBC WITH DIFFERENTIAL/PLATELET
Basophils Absolute: 0 10*3/uL (ref 0.0–0.2)
Basos: 1 %
EOS (ABSOLUTE): 0.1 10*3/uL (ref 0.0–0.4)
Eos: 2 %
Hematocrit: 48 % (ref 37.5–51.0)
Hemoglobin: 15.8 g/dL (ref 13.0–17.7)
Immature Grans (Abs): 0.1 10*3/uL (ref 0.0–0.1)
Immature Granulocytes: 1 %
Lymphocytes Absolute: 1.8 10*3/uL (ref 0.7–3.1)
Lymphs: 29 %
MCH: 25.6 pg — ABNORMAL LOW (ref 26.6–33.0)
MCHC: 32.9 g/dL (ref 31.5–35.7)
MCV: 78 fL — ABNORMAL LOW (ref 79–97)
Monocytes Absolute: 0.6 10*3/uL (ref 0.1–0.9)
Monocytes: 10 %
Neutrophils Absolute: 3.6 10*3/uL (ref 1.4–7.0)
Neutrophils: 57 %
Platelets: 228 10*3/uL (ref 150–450)
RBC: 6.18 x10E6/uL — ABNORMAL HIGH (ref 4.14–5.80)
RDW: 14.5 % (ref 11.6–15.4)
WBC: 6.3 10*3/uL (ref 3.4–10.8)

## 2023-11-09 LAB — LIPID PANEL
Chol/HDL Ratio: 5.3 {ratio} — ABNORMAL HIGH (ref 0.0–5.0)
Cholesterol, Total: 191 mg/dL (ref 100–199)
HDL: 36 mg/dL — ABNORMAL LOW (ref >39)
LDL Chol Calc (NIH): 110 mg/dL — ABNORMAL HIGH (ref 0–99)
Triglycerides: 257 mg/dL — ABNORMAL HIGH (ref 0–149)
VLDL Cholesterol Cal: 45 mg/dL — ABNORMAL HIGH (ref 5–40)

## 2023-11-09 LAB — CMP14+EGFR
ALT: 20 [IU]/L (ref 0–44)
AST: 21 [IU]/L (ref 0–40)
Albumin: 4.7 g/dL (ref 4.3–5.2)
Alkaline Phosphatase: 96 [IU]/L (ref 44–121)
BUN/Creatinine Ratio: 12 (ref 9–20)
BUN: 14 mg/dL (ref 6–20)
Bilirubin Total: 0.6 mg/dL (ref 0.0–1.2)
CO2: 23 mmol/L (ref 20–29)
Calcium: 9.6 mg/dL (ref 8.7–10.2)
Chloride: 100 mmol/L (ref 96–106)
Creatinine, Ser: 1.14 mg/dL (ref 0.76–1.27)
Globulin, Total: 2.4 g/dL (ref 1.5–4.5)
Glucose: 99 mg/dL (ref 70–99)
Potassium: 4.6 mmol/L (ref 3.5–5.2)
Sodium: 137 mmol/L (ref 134–144)
Total Protein: 7.1 g/dL (ref 6.0–8.5)
eGFR: 91 mL/min/{1.73_m2} (ref >59)

## 2023-11-12 ENCOUNTER — Encounter: Payer: Self-pay | Admitting: Medical-Surgical

## 2023-12-03 LAB — IRON AND TIBC
Iron Saturation: 30 % (ref 15–55)
Iron: 108 ug/dL (ref 38–169)
Total Iron Binding Capacity: 356 ug/dL (ref 250–450)
UIBC: 248 ug/dL (ref 111–343)

## 2023-12-03 LAB — SPECIMEN STATUS REPORT

## 2023-12-03 LAB — FERRITIN: Ferritin: 144 ng/mL (ref 30–400)

## 2023-12-25 ENCOUNTER — Encounter: Payer: Self-pay | Admitting: Physician Assistant

## 2023-12-25 ENCOUNTER — Ambulatory Visit: Payer: Self-pay | Admitting: Medical-Surgical

## 2023-12-25 ENCOUNTER — Ambulatory Visit: Admitting: Physician Assistant

## 2023-12-25 VITALS — BP 138/70 | HR 83 | Ht 69.0 in | Wt 225.0 lb

## 2023-12-25 DIAGNOSIS — R4589 Other symptoms and signs involving emotional state: Secondary | ICD-10-CM

## 2023-12-25 DIAGNOSIS — R350 Frequency of micturition: Secondary | ICD-10-CM | POA: Diagnosis not present

## 2023-12-25 DIAGNOSIS — R195 Other fecal abnormalities: Secondary | ICD-10-CM | POA: Insufficient documentation

## 2023-12-25 LAB — POCT URINALYSIS DIP (CLINITEK)
Bilirubin, UA: NEGATIVE
Blood, UA: NEGATIVE
Glucose, UA: NEGATIVE mg/dL
Ketones, POC UA: NEGATIVE mg/dL
Leukocytes, UA: NEGATIVE
Nitrite, UA: NEGATIVE
POC PROTEIN,UA: NEGATIVE
Spec Grav, UA: 1.01 (ref 1.010–1.025)
Urobilinogen, UA: 0.2 U/dL
pH, UA: 7 (ref 5.0–8.0)

## 2023-12-25 NOTE — Telephone Encounter (Signed)
  Chief Complaint: rectal bleeding Symptoms: blood in stool, nausea Frequency: Several months Pertinent Negatives: Patient denies dizziness, CP, SOB Disposition: [] ED /[] Urgent Care (no appt availability in office) / [x] Appointment(In office/virtual)/ []  [place] [facility]/ [] Home Care/ [] Refused Recommended Disposition /[] [facility]/ []  Follow-up with PCP Additional Notes: Patient calls reporting "rust color water" after bowel movements intermittently for several months. Patient reports chronic constipation, but states yesterday he had diarrhea. Patient unable to quantify amount seen, states "more than I would like". Per protocol, patient to be evaluated within 24 hours. First available appointment with PCP outside of timeframe. Patient scheduled with first available provider in clinic for today at 1340 at patient request. Care advice reviewed, patient verbalized understanding and denies further questions at this time. Alerting PCP for review.    Copied from CRM [id number]. Topic: Clinical - Red Word Triage >> Dec 25, 2023 12:21 PM Ivette P wrote: [facility] that prompted transfer to Nurse Triage: Internal Bleeding Reason for Disposition  MODERATE rectal bleeding (small blood clots, passing blood without stool, or toilet water turns red)  Answer Assessment - Initial Assessment Questions 1. APPEARANCE of BLOOD: "What color is it?" "Is it passed separately, on the surface of the stool, or mixed in with the stool?"      Rust colored hue that sits around the stool when having bowel movement, 2. AMOUNT: "How much blood was passed?"      Cannot quantify, "more than I would like". Varies with amount of stool passed. 3. FREQUENCY: "How many times has blood been passed with the stools?"      Intermittent for several months 4. ONSET: "When was the blood first seen in the stools?" (Days or weeks)      Several months, maybe 3-4 times a week. 5. DIARRHEA: "Is there also some diarrhea?" If  Yes, ask: "How many diarrhea stools in the past 24 hours?"      Diarrhea yesterday, yellow, mucou in stool 6. CONSTIPATION: "Do you have constipation?" If Yes, ask: "How bad is it?"     Most days reports he is constipated. 7. RECURRENT SYMPTOMS: "Have you had blood in your stools before?" If Yes, ask: "When was the last time?" and "What happened that time?"      Several months but has not seen provider 8. BLOOD THINNERS: "Do you take any blood thinners?" (e.g., Coumadin/warfarin, Pradaxa/dabigatran, aspirin)     Denies 9. OTHER SYMPTOMS: "Do you have any other symptoms?"  (e.g., abdomen pain, vomiting, dizziness, fever)     Nausea at times  Protocols used: Rectal Bleeding-A-AH

## 2023-12-25 NOTE — Progress Notes (Signed)
 Acute Office Visit  Subjective:     Patient ID: Seth Torres, male    DOB: 1996/04/15, 28 y.o.   MRN: [id number]  CC: rectal bleeding   HPI Patient is a 28 yo male who presents with a chief complaint of rectal bleeding. Onset x3 months ago. He says it is rust color. He is very anxious and is concerned he may have cancer.   Lost 7lbs since jan but he is trying  Nausea but no vomiting. Not taking medication.   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 BP Readings from Last 3 Encounters:  12/25/23 138/70  11/08/23 101/69  09/14/22 139/71   Wt Readings from Last 3 Encounters:  12/25/23 225 lb (102.1 kg)  11/08/23 232 lb 3.2 oz (105.3 kg)  09/14/22 240 lb (108.9 kg)      Physical Exam Constitutional:      Appearance: Normal appearance.  HENT:     Head: Normocephalic and atraumatic.  Cardiovascular:     Rate and Rhythm: Normal rate and regular rhythm.     Pulses: Normal pulses.     Heart sounds: Normal heart sounds.  Pulmonary:     Breath sounds: Normal breath sounds.  Musculoskeletal:         General: Normal range of motion.  Skin:    General: Skin is warm.  Neurological:     Mental Status: He is alert.   .. Results for orders placed or performed in visit on 12/25/23  POCT URINALYSIS DIP (CLINITEK)   Collection Time: 12/25/23  3:13 PM  Result Value Ref Range   Color, UA yellow yellow   Clarity, UA clear clear   Glucose, UA negative negative mg/dL   Bilirubin, UA negative negative   Ketones, POC UA negative negative mg/dL   Spec Grav, UA 8.469 6.295 - 1.025   Blood, UA negative negative   pH, UA 7.0 5.0 - 8.0   POC PROTEIN,UA negative negative, trace   Urobilinogen, UA 0.2 0.2 or 1.0 E.U./dL   Nitrite, UA Negative Negative   Leukocytes, UA Negative Negative    Assessment & Plan:  Marland KitchenMarland KitchenDael was seen today for rectal bleeding.  Diagnoses and all orders for this visit:  Change in stool -     CBC w/Diff/Platelet -     Fe+TIBC+Fer -     CMP14+EGFR  Abnormal stool color -     CBC w/Diff/Platelet -     Fe+TIBC+Fer -     CMP14+EGFR  Urinary frequency -     PSA -  POCT URINALYSIS DIP (CLINITEK)      Ilean China, Student-PA

## 2023-12-25 NOTE — Patient Instructions (Signed)
 Will get labs Return hemoccult

## 2023-12-26 LAB — CMP14+EGFR
ALT: 14 IU/L (ref 0–44)
AST: 17 IU/L (ref 0–40)
Albumin: 4.6 g/dL (ref 4.3–5.2)
Alkaline Phosphatase: 97 IU/L (ref 44–121)
BUN/Creatinine Ratio: 15 (ref 9–20)
BUN: 16 mg/dL (ref 6–20)
Bilirubin Total: 0.3 mg/dL (ref 0.0–1.2)
CO2: 22 mmol/L (ref 20–29)
Calcium: 9.5 mg/dL (ref 8.7–10.2)
Chloride: 103 mmol/L (ref 96–106)
Creatinine, Ser: 1.09 mg/dL (ref 0.76–1.27)
Globulin, Total: 2 g/dL (ref 1.5–4.5)
Glucose: 87 mg/dL (ref 70–99)
Potassium: 4.3 mmol/L (ref 3.5–5.2)
Sodium: 140 mmol/L (ref 134–144)
Total Protein: 6.6 g/dL (ref 6.0–8.5)
eGFR: 96 mL/min/{1.73_m2} (ref >59)

## 2023-12-26 LAB — CBC WITH DIFFERENTIAL/PLATELET
Basophils Absolute: 0.1 x10E3/uL (ref 0.0–0.2)
Basos: 1 %
EOS (ABSOLUTE): 0.1 x10E3/uL (ref 0.0–0.4)
Eos: 2 %
Hematocrit: 44.9 % (ref 37.5–51.0)
Hemoglobin: 14.7 g/dL (ref 13.0–17.7)
Immature Grans (Abs): 0 x10E3/uL (ref 0.0–0.1)
Immature Granulocytes: 0 %
Lymphocytes Absolute: 2.1 x10E3/uL (ref 0.7–3.1)
Lymphs: 33 %
MCH: 25.5 pg — ABNORMAL LOW (ref 26.6–33.0)
MCHC: 32.7 g/dL (ref 31.5–35.7)
MCV: 78 fL — ABNORMAL LOW (ref 79–97)
Monocytes Absolute: 0.5 x10E3/uL (ref 0.1–0.9)
Monocytes: 8 %
Neutrophils Absolute: 3.4 x10E3/uL (ref 1.4–7.0)
Neutrophils: 56 %
Platelets: 180 x10E3/uL (ref 150–450)
RBC: 5.77 x10E6/uL (ref 4.14–5.80)
RDW: 14.7 % (ref 11.6–15.4)
WBC: 6.2 x10E3/uL (ref 3.4–10.8)

## 2023-12-26 LAB — IRON,TIBC AND FERRITIN PANEL
Ferritin: 105 ng/mL (ref 30–400)
Iron Saturation: 26 % (ref 15–55)
Iron: 92 ug/dL (ref 38–169)
Total Iron Binding Capacity: 359 ug/dL (ref 250–450)
UIBC: 267 ug/dL (ref 111–343)

## 2023-12-27 ENCOUNTER — Encounter: Payer: Self-pay | Admitting: Physician Assistant

## 2023-12-27 DIAGNOSIS — R4589 Other symptoms and signs involving emotional state: Secondary | ICD-10-CM | POA: Insufficient documentation

## 2023-12-27 NOTE — Progress Notes (Signed)
 Shawndell,   Kidney, liver, glucose look GREAT.  Normal hemoglobin and iron panel meaning no signs of any significant blood loss.  White blood count is normal.   The size of RBC are small and they hold a little less hemoglobin. Would you like to be sent to hematology to work this up. It could be represent a thalassemia,genetic disorder that effects RBC.

## 2024-01-01 ENCOUNTER — Other Ambulatory Visit (INDEPENDENT_AMBULATORY_CARE_PROVIDER_SITE_OTHER): Admitting: *Deleted

## 2024-01-01 ENCOUNTER — Encounter: Payer: Self-pay | Admitting: Physician Assistant

## 2024-01-01 DIAGNOSIS — K921 Melena: Secondary | ICD-10-CM

## 2024-01-01 DIAGNOSIS — R718 Other abnormality of red blood cells: Secondary | ICD-10-CM | POA: Insufficient documentation

## 2024-01-01 LAB — POC HEMOCCULT BLD/STL (HOME/3-CARD/SCREEN)
Card #2 Fecal Occult Blod, POC: NEGATIVE
Card #3 Fecal Occult Blood, POC: NEGATIVE
Fecal Occult Blood, POC: NEGATIVE

## 2024-01-01 NOTE — Progress Notes (Signed)
 GREAT News. NO blood detected in stool on cards!

## 2024-01-06 ENCOUNTER — Ambulatory Visit (INDEPENDENT_AMBULATORY_CARE_PROVIDER_SITE_OTHER): Payer: BLUE CROSS/BLUE SHIELD

## 2024-01-06 VITALS — Temp 97.8°F | Ht 69.0 in

## 2024-01-06 DIAGNOSIS — Z23 Encounter for immunization: Secondary | ICD-10-CM | POA: Diagnosis not present

## 2024-01-06 NOTE — Progress Notes (Signed)
   Established Patient Office Visit  Subjective   Patient ID: Hatim Homann, male    DOB: November 05, 1995  Age: 28 y.o. MRN: [id number]  Chief Complaint  Patient presents with   need for HPV vaccine    HPV-9 vaccine nurse visit.     HPI  HPV vaccine #2 - nurse visit.   ROS    Objective:     Temp 97.8 F (36.6 C)   Ht 5\' 9"  (1.753 m)   BMI 33.23 kg/m    Physical Exam   No results found for any visits on 01/06/24.    The ASCVD Risk score (Arnett DK, et al., 2019) failed to calculate for the following reasons:   The 2019 ASCVD risk score is only valid for ages 84 to 74    Assessment & Plan:  HPV Vaccine #2 - admin IM Right deltoid. Patient tolerated injection well without complications.  Patient will return for 3rd HPV vaccine on 05/07/24 -(appt already schld as nurse visit ) Problem List Items Addressed This Visit       Other   Need for HPV vaccination - Primary   Relevant Orders   HPV 9-valent vaccine,Recombinat (Completed)    Return in about 4 months (around 05/07/2024) for 3rd HPV vaccine as nurse visit. Elizabeth Palau, LPN

## 2024-01-06 NOTE — Patient Instructions (Signed)
 Return on 05/07/24 for 3rd HPV vaccine as nurse visit.

## 2024-01-09 ENCOUNTER — Encounter: Payer: Self-pay | Admitting: Medical Oncology

## 2024-01-09 ENCOUNTER — Inpatient Hospital Stay: Admitting: Medical Oncology

## 2024-01-09 ENCOUNTER — Inpatient Hospital Stay: Attending: Medical Oncology

## 2024-01-09 VITALS — BP 140/70 | HR 54 | Temp 97.8°F | Resp 20 | Ht 69.0 in | Wt 225.0 lb

## 2024-01-09 DIAGNOSIS — F17210 Nicotine dependence, cigarettes, uncomplicated: Secondary | ICD-10-CM | POA: Insufficient documentation

## 2024-01-09 DIAGNOSIS — D563 Thalassemia minor: Secondary | ICD-10-CM | POA: Insufficient documentation

## 2024-01-09 DIAGNOSIS — R161 Splenomegaly, not elsewhere classified: Secondary | ICD-10-CM | POA: Insufficient documentation

## 2024-01-09 DIAGNOSIS — R001 Bradycardia, unspecified: Secondary | ICD-10-CM | POA: Insufficient documentation

## 2024-01-09 DIAGNOSIS — E538 Deficiency of other specified B group vitamins: Secondary | ICD-10-CM | POA: Diagnosis not present

## 2024-01-09 DIAGNOSIS — E559 Vitamin D deficiency, unspecified: Secondary | ICD-10-CM | POA: Insufficient documentation

## 2024-01-09 DIAGNOSIS — R5383 Other fatigue: Secondary | ICD-10-CM | POA: Diagnosis not present

## 2024-01-09 DIAGNOSIS — R718 Other abnormality of red blood cells: Secondary | ICD-10-CM | POA: Diagnosis not present

## 2024-01-09 LAB — CBC WITH DIFFERENTIAL (CANCER CENTER ONLY)
Abs Immature Granulocytes: 0.06 10*3/uL (ref 0.00–0.07)
Basophils Absolute: 0.1 10*3/uL (ref 0.0–0.1)
Basophils Relative: 1 %
Eosinophils Absolute: 0.1 10*3/uL (ref 0.0–0.5)
Eosinophils Relative: 2 %
HCT: 43.5 % (ref 39.0–52.0)
Hemoglobin: 14.5 g/dL (ref 13.0–17.0)
Immature Granulocytes: 1 %
Lymphocytes Relative: 36 %
Lymphs Abs: 2.4 10*3/uL (ref 0.7–4.0)
MCH: 25.8 pg — ABNORMAL LOW (ref 26.0–34.0)
MCHC: 33.3 g/dL (ref 30.0–36.0)
MCV: 77.4 fL — ABNORMAL LOW (ref 80.0–100.0)
Monocytes Absolute: 0.5 10*3/uL (ref 0.1–1.0)
Monocytes Relative: 8 %
Neutro Abs: 3.5 10*3/uL (ref 1.7–7.7)
Neutrophils Relative %: 52 %
Platelet Count: 202 10*3/uL (ref 150–400)
RBC: 5.62 MIL/uL (ref 4.22–5.81)
RDW: 13.7 % (ref 11.5–15.5)
WBC Count: 6.6 10*3/uL (ref 4.0–10.5)
nRBC: 0 % (ref 0.0–0.2)

## 2024-01-09 LAB — CMP (CANCER CENTER ONLY)
ALT: 13 U/L (ref 0–44)
AST: 15 U/L (ref 15–41)
Albumin: 4.6 g/dL (ref 3.5–5.0)
Alkaline Phosphatase: 79 U/L (ref 38–126)
Anion gap: 7 (ref 5–15)
BUN: 16 mg/dL (ref 6–20)
CO2: 29 mmol/L (ref 22–32)
Calcium: 9.3 mg/dL (ref 8.9–10.3)
Chloride: 103 mmol/L (ref 98–111)
Creatinine: 1.07 mg/dL (ref 0.61–1.24)
GFR, Estimated: >60 mL/min (ref >60)
Glucose, Bld: 97 mg/dL (ref 70–99)
Potassium: 4.2 mmol/L (ref 3.5–5.1)
Sodium: 139 mmol/L (ref 135–145)
Total Bilirubin: 0.5 mg/dL (ref 0.0–1.2)
Total Protein: 6.7 g/dL (ref 6.5–8.1)

## 2024-01-09 LAB — SAVE SMEAR(SSMR), FOR PROVIDER SLIDE REVIEW

## 2024-01-09 LAB — C-REACTIVE PROTEIN: CRP: 1.3 mg/dL — ABNORMAL HIGH (ref <1.0)

## 2024-01-09 LAB — SEDIMENTATION RATE: Sed Rate: 2 mm/h (ref 0–16)

## 2024-01-09 LAB — RETICULOCYTES
Immature Retic Fract: 13 % (ref 2.3–15.9)
RBC.: 5.54 MIL/uL (ref 4.22–5.81)
Retic Count, Absolute: 74.2 10*3/uL (ref 19.0–186.0)
Retic Ct Pct: 1.3 % (ref 0.4–3.1)

## 2024-01-09 LAB — VITAMIN D 25 HYDROXY (VIT D DEFICIENCY, FRACTURES): Vit D, 25-Hydroxy: 24.15 ng/mL — ABNORMAL LOW (ref 30–100)

## 2024-01-09 LAB — FOLATE: Folate: 12.5 ng/mL (ref >5.9)

## 2024-01-09 LAB — VITAMIN B12: Vitamin B-12: 195 pg/mL (ref 180–914)

## 2024-01-09 LAB — TSH: TSH: 1.351 u[IU]/mL (ref 0.350–4.500)

## 2024-01-09 NOTE — Progress Notes (Signed)
 [facility] [phone]   [phone]  INITIAL CONSULT NOTE  Patient Care Team: Christen Butter, NP as PCP - General (Nurse Practitioner)  CHIEF COMPLAINTS/PURPOSE OF CONSULTATION:  Low MCV, abnormal CBC  HISTORY OF PRESENTING ILLNESS:  Seth Torres 28 y.o. male is referred to our office by their PCP Christen Butter NP for low MCV value,  Patient reports that he has had a low MCV value for as long as he can remember. Back in 2019 he had a bad case of mononucleosis. He was sick off and on for about 2 years. That is when he first started having labs drawn and they have always had the low MCV value.    Other than this bout of illness he reports himself as healthy. He has GERD for which he occasionally takes pepcid and protonix. He also have chronic fatigue. He thinks that this may be age related.   He does snore. He wakes up often at night. He wakes up tired. He does sometimes get morning headaches. No history of thyroid or nutritional deficiencies.   No unintentional weight loss, fevers, night sweats.   No chronic abdominal pain N/V/D/C.   No family history of Thalassemia. No personal or family history of blood disorders.   He works at the [employer]  MEDICAL HISTORY:  Past Medical History:  Diagnosis Date   Ganglion cyst of dorsum of right wrist    Mononucleosis     SURGICAL HISTORY: No past surgical history on file.  SOCIAL HISTORY: Social History   Socioeconomic History   Marital status: Single    Spouse name: Not on file   Number of children: Not on file   Years of education: Not on file   Highest education level: GED or equivalent  Occupational History   Not on file  Tobacco Use   Smoking status: Every Day    Current packs/day: 0.50    Average packs/day: 0.5 packs/day for 13.2 years (6.6 ttl pk-yrs)    Types: Cigarettes    Start date: 2012   Smokeless tobacco: Former    Types: Engineer, drilling   Vaping status: Former   Substance and Sexual Activity   Alcohol use: Not Currently   Drug use: Not Currently   Sexual activity: Yes  Other Topics Concern   Not on file  Social History Narrative   Not on file   Social Drivers of Health   Financial Resource Strain: Low Risk  (03/08/2023)   Overall Financial Resource Strain (CARDIA)    Difficulty of Paying Living Expenses: Not hard at all  Food Insecurity: No Food Insecurity (03/08/2023)   Hunger Vital Sign    Worried About Running Out of Food in the Last Year: Never true    Ran Out of Food in the Last Year: Never true  Transportation Needs: No Transportation Needs (03/08/2023)   PRAPARE - Administrator, Civil Service (Medical): No    Lack of Transportation (Non-Medical): No  Physical Activity: Sufficiently Active (03/08/2023)   Exercise Vital Sign    Days of Exercise per Week: 6 days    Minutes of Exercise per Session: 150+ min  Stress: No Stress Concern Present (03/08/2023)   [employer] of Occupational Health - Occupational Stress Questionnaire    Feeling of Stress : Not at all  Social Connections: Moderately Integrated (03/08/2023)   Social Connection and Isolation Panel [NHANES]    Frequency of Communication with Friends and Family: More than three times  a week    Frequency of Social Gatherings with Friends and Family: Never    Attends Religious Services: Never    Database administrator or Organizations: Yes    Attends Engineer, structural: More than 4 times per year    Marital Status: Living with partner  Intimate Partner Violence: Unknown (01/17/2022)   Received from [employer], [facility]   HITS    Physically Hurt: Not on file    Insult or Talk Down To: Not on file    Threaten Physical Harm: Not on file    Scream or Curse: Not on file    FAMILY HISTORY: Family History  Problem Relation Age of Onset   Healthy Mother    Healthy Father     ALLERGIES:  has no known allergies.  MEDICATIONS:  Current  Outpatient Medications  Medication Sig Dispense Refill   acetaminophen (TYLENOL) 325 MG tablet Take 650 mg by mouth. (Patient not taking: Reported on 01/09/2024)     famotidine (PEPCID) 20 MG tablet Take 1 tablet (20 mg total) by mouth 2 (two) times daily. (Patient not taking: Reported on 01/09/2024) 60 tablet 1   naproxen sodium (ALEVE) 220 MG tablet Take 220 mg by mouth. (Patient not taking: Reported on 01/09/2024)     pantoprazole (PROTONIX) 40 MG tablet Take 1 tablet (40 mg total) by mouth daily. (Patient not taking: Reported on 01/09/2024) 30 tablet 3   No current facility-administered medications for this visit.    REVIEW OF SYSTEMS:   Constitutional: ( - ) fevers, ( - )  chills , ( - ) night sweats Eyes: ( - ) blurriness of vision, ( - ) double vision, ( - ) watery eyes Ears, nose, mouth, throat, and face: ( - ) mucositis, ( - ) sore throat Respiratory: ( - ) cough, ( - ) dyspnea, ( - ) wheezes Cardiovascular: ( - ) palpitation, ( - ) chest discomfort, ( - ) lower extremity swelling Gastrointestinal:  ( - ) nausea, ( - ) heartburn, ( - ) change in bowel habits Skin: ( - ) abnormal skin rashes Lymphatics: ( - ) new lymphadenopathy, ( - ) easy bruising Neurological: ( - ) numbness, ( - ) tingling, ( - ) new weaknesses Behavioral/Psych: ( - ) mood change, ( - ) new changes  All other systems were reviewed with the patient and are negative.  PHYSICAL EXAMINATION: ECOG PERFORMANCE STATUS: 1 - Symptomatic but completely ambulatory  Vitals:   01/09/24 1308  BP: (!) 140/70  Pulse: (!) 54  Resp: 20  Temp: 97.8 F (36.6 C)  SpO2: 100%   Filed Weights   01/09/24 1308  Weight: 225 lb (102.1 kg)    GENERAL: well appearing male in NAD  SKIN: skin color, texture, turgor are normal, no rashes or significant lesions EYES: conjunctiva are pink and non-injected, sclera clear OROPHARYNX: no exudate, no erythema; lips, buccal mucosa, and tongue normal, FTP 3 NECK: supple, non-tender,  enlarged circumference of neck  LYMPH:  no palpable lymphadenopathy in the cervical, axillary or supraclavicular lymph nodes.  LUNGS: clear to auscultation and percussion with normal breathing effort HEART: regular rate & rhythm and no murmurs and no lower extremity edema ABDOMEN: soft, non-tender, non-distended, normal bowel sounds, palpable spleen is non-tender  Musculoskeletal: no cyanosis of digits and no clubbing  PSYCH: alert & oriented x 3, fluent speech NEURO: no focal motor/sensory deficits  LABORATORY DATA:  Pending   BLOOD FILM:  Review of the peripheral  blood smear showed normal appearing white cells with neutrophils that were appropriately lobated and granulated. There was no predominance of bi-lobed or hyper-segmented neutrophils appreciated. No Dohle bodies were noted. There was no left shifting, immature forms or blasts noted. Lymphocytes remain normal in size without any predominance of large granular lymphocytes. Red cells show no anisopoikilocytosis, macrocytes , microcytes or polychromasia. There were no schistocytes, target cells, echinocytes, acanthocytes, dacrocytes, or stomatocytes.There was no rouleaux formation, nucleated red cells, or intra-cellular inclusions noted. The platelets are normal in size, shape, and color without any clumping evident.  ASSESSMENT & PLAN Seth Torres is a 28 y.o. male who was referred to [place] for a low MCV value:  His CBC changes are chronic in nature. Review of his recent labs show no signs of iron deficiency with an iron sat of 26% and 30%, as well as a ferritin of 144, and 105. No evidence of kidney or liver diseased based off of recent CMP. No known family history of thalassemia.   My concern today is lower given his age, history, lab findings but given his fatigue, and palpable spleen, I would like to work this up further with additional labs, blood smear, and abdominal US.  BP slightly elevated today likely secondary to nervousness  regarding him being a new patient at the cancer center. Previous recent readings were normal.   He does have mild bradycardia today however on exam his heart rate was 62 and he is physical fit/active with his job. Should this worsen unexpectedly I would suggest a cardiology referral.   We will plan on seeing him again in 1 month for repeat labs and symptoms monitoring.    Disposition Labs today Abdominal US ordered RTC 1 month APP, labs (CBC w/, CMP, LDH, retic, smear)  All questions were answered. The patient knows to call the clinic with any problems, questions or concerns.  I have spent a total of 30 minutes minutes of face-to-face and non-face-to-face time, preparing to see the patient, obtaining and/or reviewing separately obtained history, performing a medically appropriate examination, counseling and educating the patient, ordering medications/tests/procedures, referring and communicating with other health care professionals, documenting clinical information in the electronic health record, independently interpreting results and communicating results to the patient, and care coordination.    Clent Jacks PA-C Department of Hematology/Oncology [facility]

## 2024-01-13 LAB — HGB FRACTIONATION CASCADE
Hgb A2: 4.9 % — ABNORMAL HIGH (ref 1.8–3.2)
Hgb A: 95.1 % — ABNORMAL LOW (ref 96.4–98.8)
Hgb F: 0 % (ref 0.0–2.0)
Hgb S: 0 %

## 2024-01-14 ENCOUNTER — Ambulatory Visit (HOSPITAL_BASED_OUTPATIENT_CLINIC_OR_DEPARTMENT_OTHER)
Admission: RE | Admit: 2024-01-14 | Discharge: 2024-01-14 | Disposition: A | Discharge status: Home / Self Care | Source: Ambulatory Visit | Attending: Medical Oncology | Admitting: Medical Oncology

## 2024-01-14 DIAGNOSIS — R161 Splenomegaly, not elsewhere classified: Secondary | ICD-10-CM | POA: Diagnosis present

## 2024-01-14 DIAGNOSIS — R5383 Other fatigue: Secondary | ICD-10-CM | POA: Diagnosis present

## 2024-01-14 DIAGNOSIS — R718 Other abnormality of red blood cells: Secondary | ICD-10-CM | POA: Diagnosis present

## 2024-01-20 ENCOUNTER — Other Ambulatory Visit: Payer: Self-pay | Admitting: Medical Oncology

## 2024-01-20 DIAGNOSIS — R718 Other abnormality of red blood cells: Secondary | ICD-10-CM

## 2024-01-20 DIAGNOSIS — R5383 Other fatigue: Secondary | ICD-10-CM

## 2024-01-20 MED ORDER — ERGOCALCIFEROL 1.25 MG (50000 UT) PO CAPS: 50000.0000 [IU] | ORAL_CAPSULE | ORAL | 3 refills | Status: AC

## 2024-01-21 ENCOUNTER — Encounter: Payer: Self-pay | Admitting: Medical Oncology

## 2024-02-01 ENCOUNTER — Other Ambulatory Visit: Payer: Self-pay | Admitting: Medical-Surgical

## 2024-02-10 ENCOUNTER — Encounter: Payer: Self-pay | Admitting: Medical Oncology

## 2024-02-10 ENCOUNTER — Inpatient Hospital Stay (HOSPITAL_BASED_OUTPATIENT_CLINIC_OR_DEPARTMENT_OTHER): Admitting: Medical Oncology

## 2024-02-10 ENCOUNTER — Inpatient Hospital Stay: Attending: Medical Oncology

## 2024-02-10 VITALS — BP 136/78 | HR 52 | Temp 97.9°F | Resp 18 | Ht 69.0 in | Wt 228.1 lb

## 2024-02-10 DIAGNOSIS — R718 Other abnormality of red blood cells: Secondary | ICD-10-CM | POA: Diagnosis present

## 2024-02-10 DIAGNOSIS — E538 Deficiency of other specified B group vitamins: Secondary | ICD-10-CM | POA: Diagnosis not present

## 2024-02-10 DIAGNOSIS — D563 Thalassemia minor: Secondary | ICD-10-CM

## 2024-02-10 DIAGNOSIS — R161 Splenomegaly, not elsewhere classified: Secondary | ICD-10-CM

## 2024-02-10 DIAGNOSIS — E559 Vitamin D deficiency, unspecified: Secondary | ICD-10-CM | POA: Diagnosis not present

## 2024-02-10 DIAGNOSIS — R5383 Other fatigue: Secondary | ICD-10-CM

## 2024-02-10 LAB — LACTATE DEHYDROGENASE: LDH: 124 U/L (ref 98–192)

## 2024-02-10 LAB — CBC WITH DIFFERENTIAL (CANCER CENTER ONLY)
Abs Immature Granulocytes: 0.07 10*3/uL (ref 0.00–0.07)
Basophils Absolute: 0 10*3/uL (ref 0.0–0.1)
Basophils Relative: 1 %
Eosinophils Absolute: 0.1 10*3/uL (ref 0.0–0.5)
Eosinophils Relative: 2 %
HCT: 44.4 % (ref 39.0–52.0)
Hemoglobin: 15.1 g/dL (ref 13.0–17.0)
Immature Granulocytes: 1 %
Lymphocytes Relative: 37 %
Lymphs Abs: 2.7 10*3/uL (ref 0.7–4.0)
MCH: 25.8 pg — ABNORMAL LOW (ref 26.0–34.0)
MCHC: 34 g/dL (ref 30.0–36.0)
MCV: 75.8 fL — ABNORMAL LOW (ref 80.0–100.0)
Monocytes Absolute: 0.6 10*3/uL (ref 0.1–1.0)
Monocytes Relative: 8 %
Neutro Abs: 3.7 10*3/uL (ref 1.7–7.7)
Neutrophils Relative %: 51 %
Platelet Count: 208 10*3/uL (ref 150–400)
RBC: 5.86 MIL/uL — ABNORMAL HIGH (ref 4.22–5.81)
RDW: 13.5 % (ref 11.5–15.5)
WBC Count: 7.2 10*3/uL (ref 4.0–10.5)
nRBC: 0 % (ref 0.0–0.2)

## 2024-02-10 LAB — CMP (CANCER CENTER ONLY)
ALT: 16 U/L (ref 0–44)
AST: 15 U/L (ref 15–41)
Albumin: 4.7 g/dL (ref 3.5–5.0)
Alkaline Phosphatase: 79 U/L (ref 38–126)
Anion gap: 8 (ref 5–15)
BUN: 14 mg/dL (ref 6–20)
CO2: 26 mmol/L (ref 22–32)
Calcium: 9.6 mg/dL (ref 8.9–10.3)
Chloride: 103 mmol/L (ref 98–111)
Creatinine: 1.16 mg/dL (ref 0.61–1.24)
GFR, Estimated: >60 mL/min (ref >60)
Glucose, Bld: 76 mg/dL (ref 70–99)
Potassium: 4 mmol/L (ref 3.5–5.1)
Sodium: 137 mmol/L (ref 135–145)
Total Bilirubin: 0.4 mg/dL (ref 0.0–1.2)
Total Protein: 7.4 g/dL (ref 6.5–8.1)

## 2024-02-10 LAB — RETIC PANEL
Immature Retic Fract: 12.8 % (ref 2.3–15.9)
RBC.: 5.93 MIL/uL — ABNORMAL HIGH (ref 4.22–5.81)
Retic Count, Absolute: 82.4 10*3/uL (ref 19.0–186.0)
Retic Ct Pct: 1.4 % (ref 0.4–3.1)
Reticulocyte Hemoglobin: 29.9 pg (ref >27.9)

## 2024-02-10 LAB — VITAMIN B12: Vitamin B-12: 462 pg/mL (ref 180–914)

## 2024-02-10 LAB — VITAMIN D 25 HYDROXY (VIT D DEFICIENCY, FRACTURES): Vit D, 25-Hydroxy: 43.53 ng/mL (ref 30–100)

## 2024-02-10 LAB — SAVE SMEAR(SSMR), FOR PROVIDER SLIDE REVIEW

## 2024-02-10 MED ORDER — FOLIC ACID 1 MG PO TABS: 1.0000 mg | ORAL_TABLET | Freq: Every day | ORAL | 3 refills | Status: DC

## 2024-02-10 NOTE — Progress Notes (Signed)
 Hematology and Oncology Follow Up Visit  Seth Torres [id number] June 01, 1996 28 y.o. 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 He reported that in 2019 he had a significant case of mononucleosis and has had fatigue since.   On his physical exam his spleen was palpable. Review of his recent labs showed no signs of iron deficiency with an iron sat of 26% and 30%, as well as a ferritin of 144, and 105. No evidence of kidney or liver diseased based off of recent CMP. No known family history of thalassemia. Given his symptoms, young age, and palpable spleen a blood smear and abdominal US  were suggested.   He underwent a US  of the spleen on 01/14/2024 which showed that his spleen had an estimates volume of 353 ML, mildly enlarged. Smear was unremarkable. He was found to have Beta Thalassemia Minor. This is the likely cause of his microcytosis and mild splenomegaly.   Due to his fatigue we also checked nutritional labs which showed Vitamin D  Deficiency as well as B12 deficiency. He was started on 50,000 units vitamin D  once weekly and once daily oral B12 1,000 mcg.   Today he states that he is has been feeling better since starting the B12 and vitamin D . Symptoms of fatigue have greatly improved. He has completed his vitamin D  course. Labs pending today.   No night sweats, new bone pains, unintentional weight loss or abdominal pains.       Wt Readings from Last 3 Encounters:  02/10/24 228  lb 1.9 oz (103.5 kg)  01/09/24 225 lb (102.1 kg)  12/25/23 225 lb (102.1 kg)     Medications:   Current Outpatient Medications:    cyanocobalamin  (VITAMIN B12) 1000 MCG tablet, Take 1,000 mcg by mouth 2 (two) times daily., Disp: , Rfl:    ergocalciferol  (VITAMIN D2) 1.25 MG (50000 UT) capsule, Take 1 capsule (50,000 Units total) by mouth once a week., Disp: 4 capsule, Rfl: 3   folic acid  (FOLVITE ) 1 MG tablet, Take 1 tablet (1 mg total) by mouth daily., Disp: 90 tablet, Rfl: 3   famotidine  (PEPCID ) 20 MG tablet, Take 1 tablet (20 mg total) by mouth 2 (two) times daily. (Patient not taking: Reported on 02/10/2024), Disp: 60 tablet, Rfl: 1  Allergies: No Known Allergies  Past Medical History, Surgical history, Social history, and Family History were reviewed and updated.  Review of Systems: Review of Systems  Constitutional:  Negative for chills, fatigue, fever and unexpected weight change.  HENT:  Negative.    Eyes: Negative.   Respiratory: Negative.    Cardiovascular: Negative.   Gastrointestinal: Negative.   Endocrine: Negative.   Genitourinary: Negative.    Musculoskeletal: Negative.   Skin: Negative.   Neurological: Negative.   Hematological: Negative.   Psychiatric/Behavioral: Negative.     Physical Exam:  height is 5\' 9"  (1.753 m) and weight is 228 lb 1.9 oz (103.5 kg). His oral  temperature is 97.9 F (36.6 C). His blood pressure is 136/78 and his pulse is 52 (abnormal). His respiration is 18 and oxygen saturation is 100%.   Physical Exam General: NAD Cardiovascular: regular rate and rhythm Pulmonary: clear ant fields Abdomen: soft, nontender, + bowel sounds GU: no suprapubic tenderness Extremities: no edema, no joint deformities Skin: no rashes Neurological: Weakness but otherwise nonfocal   Lab Results  Component Value Date   WBC 7.2 02/10/2024   HGB 15.1 02/10/2024   HCT 44.4 02/10/2024   MCV 75.8 (L) 02/10/2024   PLT 208 02/10/2024     Chemistry       Component Value Date/Time   NA 139 01/09/2024 1335   NA 140 12/25/2023 1406   K 4.2 01/09/2024 1335   CL 103 01/09/2024 1335   CO2 29 01/09/2024 1335   BUN 16 01/09/2024 1335   BUN 16 12/25/2023 1406   CREATININE 1.07 01/09/2024 1335   CREATININE 1.08 03/23/2019 1901      Component Value Date/Time   CALCIUM 9.3 01/09/2024 1335   ALKPHOS 79 01/09/2024 1335   AST 15 01/09/2024 1335   ALT 13 01/09/2024 1335   BILITOT 0.5 01/09/2024 1335      Assessment and Plan- Patient is a 28 y.o. male who was referred to our office for low MCV level and fatigue. He was found to have Beta Thalassemia Minor, vitamin D  and B12 deficiency. He also had a mildly enlarged spleen. Mild splenomegaly and MCV level attributed to his beta thalassemia.   We discussed Beta thalassemia minor including that genetic testing would be recommended for family planning purposes. We also discussed that he will likely have a low MCV value due to this condition as well as the mild splenomegaly. We discussed red flags and precautions regarding this. For now I would recommend folic acid  supplementation to help with his hemoglobin formation. I would also like to monitor his B12 and vitamin D  levels as they relate to his CBC counts and fatigue.   Should he have unexpected worsening of counts, worsening symptoms I would suggest further work up. Given his nutritional deficiencies he very well may benefit from a GI work up which we discussed.     Disposition: RTC 6 months APP, labs (CBC w/, CMP, LDH, smear, vitamin B12, vitamin D )   [name] PA-C 4/28/20253:16 PM

## 2024-02-11 ENCOUNTER — Encounter: Payer: Self-pay | Admitting: Medical Oncology

## 2024-02-11 ENCOUNTER — Other Ambulatory Visit: Payer: Self-pay | Admitting: Medical Oncology

## 2024-02-20 ENCOUNTER — Ambulatory Visit: Admitting: Medical-Surgical

## 2024-02-20 ENCOUNTER — Encounter: Payer: Self-pay | Admitting: Medical-Surgical

## 2024-02-20 VITALS — BP 121/60 | HR 53 | Resp 20 | Ht 69.0 in | Wt 232.0 lb

## 2024-02-20 DIAGNOSIS — E639 Nutritional deficiency, unspecified: Secondary | ICD-10-CM | POA: Diagnosis not present

## 2024-02-20 DIAGNOSIS — R195 Other fecal abnormalities: Secondary | ICD-10-CM

## 2024-02-20 NOTE — Progress Notes (Signed)
        Established patient visit  History, exam, impression, and plan:  1. Nutritional deficiency (Primary) 2. Change in stool Pleasant 28 year old male presenting today for discussion of recent referral to oncology.  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  During their evaluation, they also found that he was vitamin D  and vitamin B12 deficient.  He was started on oral supplementation but it was suggested that he see GI to be evaluated since he is very young to have such deficiencies.  Notes that he also has other GI symptoms including reflux, some bloating/abdominal pain, maroon-colored stools, and occasional excessive flatulence.  On evaluation today, his abdomen is soft, nondistended, nontender.  Patient is interested in GI referral so order placed today. - Ambulatory referral to Gastroenterology  Procedures performed this visit: None.  Return if symptoms worsen or fail to improve.  __________________________________ Maryl Snook, DNP, APRN, FNP-BC Primary Care and Sports Medicine [facility]

## 2024-03-05 ENCOUNTER — Encounter: Payer: Self-pay | Admitting: Medical-Surgical

## 2024-03-11 ENCOUNTER — Encounter: Payer: Self-pay | Admitting: Physician Assistant

## 2024-04-06 ENCOUNTER — Encounter: Payer: Self-pay | Admitting: Medical-Surgical

## 2024-04-30 ENCOUNTER — Encounter: Payer: Self-pay | Admitting: Physician Assistant

## 2024-04-30 ENCOUNTER — Other Ambulatory Visit (INDEPENDENT_AMBULATORY_CARE_PROVIDER_SITE_OTHER)

## 2024-04-30 ENCOUNTER — Ambulatory Visit: Admitting: Physician Assistant

## 2024-04-30 VITALS — BP 114/76 | HR 75 | Ht 71.0 in | Wt 240.0 lb

## 2024-04-30 DIAGNOSIS — R194 Change in bowel habit: Secondary | ICD-10-CM

## 2024-04-30 DIAGNOSIS — R195 Other fecal abnormalities: Secondary | ICD-10-CM

## 2024-04-30 DIAGNOSIS — E538 Deficiency of other specified B group vitamins: Secondary | ICD-10-CM

## 2024-04-30 DIAGNOSIS — D563 Thalassemia minor: Secondary | ICD-10-CM

## 2024-04-30 DIAGNOSIS — K219 Gastro-esophageal reflux disease without esophagitis: Secondary | ICD-10-CM

## 2024-04-30 DIAGNOSIS — E559 Vitamin D deficiency, unspecified: Secondary | ICD-10-CM

## 2024-04-30 DIAGNOSIS — R161 Splenomegaly, not elsewhere classified: Secondary | ICD-10-CM

## 2024-04-30 LAB — C-REACTIVE PROTEIN: CRP: <1 mg/dL (ref 0.5–20.0)

## 2024-04-30 LAB — SEDIMENTATION RATE: Sed Rate: 1 mm/h (ref 0–15)

## 2024-04-30 MED ORDER — NA SULFATE-K SULFATE-MG SULF 17.5-3.13-1.6 GM/177ML PO SOLN: 1.0000 | Freq: Once | ORAL | 0 refills | Status: AC

## 2024-04-30 NOTE — Patient Instructions (Addendum)
 Your provider has requested that you go to the basement level for lab work before leaving today. Press B on the elevator. The lab is located at the first door on the left as you exit the elevator.  Reflux Gourmet Rescue  It is an ALGINATE THERAPY which is the only intervention that works to safeguard the esophagus by creating a protective barrier that actually stops reflux from happening. -The general directions for use are as stated on the packaging: Take 1 teaspoon (5 ml), or more as needed or as directed by your physician, after meals and before bed. -These general directions address the most common times for reflux to occur, but our Rescue products may be taken anytime. Some individuals may take our product preemptively, when they know they will suffer from reflux, or as needed - when discomfort arises. (If taken around food, it should be consumed last.) -You do not have to take 1 teaspoon (5 ml) of the product. While one teaspoon (5ml) may be the perfect average amount to relieve reflux suffering in some, others may require more or less. You may adjust the amount of Mint Chocolate Rescue and Vanilla Caramel Rescue to the lowest amount necessary to meet your individual needs to improve your quality of life. -You may dilute the product if it is too viscous for you to consume. Keep in mind, however, that the thickness of the product was formulated to provide optimal coating and protection of your throat and esophagus. Though diluting the product is possible, it may reduce the protective function and/or length of action. -This can be used in conjunction with reflux medications and lifestyle changes.  100% ALL-NATURAL  Paraben FREE, glycerin FREE, & potassium FREE  Made entirely from all-natural ingredients considered safe for children and during pregnancy  No known side effects  All-natural flavor Gluten FREE  Allergen FREE  Vegan  Can find more information  here: NameSeizer.co.nz   Avoid spicy and acidic foods Avoid fatty foods Limit your intake of coffee, tea, alcohol, and carbonated drinks Work to maintain a healthy weight Keep the head of the bed elevated at least 3 inches with blocks or a wedge pillow if you are having any nighttime symptoms Stay upright for 2 hours after eating Avoid meals and snacks three to four hours before bedtime  FIBER SUPPLEMENT You can do metamucil or fibercon once or twice a day but if this causes gas/bloating please switch to Benefiber or Citracel.  Fiber is good for constipation/diarrhea/irritable bowel syndrome.  It can also help with weight loss and can help lower your bad cholesterol (LDL).  Please do 1 TBSP in the morning in water, coffee, or tea.  It can take up to a month before you can see a difference with your bowel movements.  It is cheapest from [employer], [employer], [employer].   Toileting tips to help with your constipation - Drink at least 64-80 ounces of water/liquid per day. - Establish a time to try to move your bowels every day.  For many people, this is after a cup of coffee or after a meal such as breakfast. - Sit all of the way back on the toilet keeping your back fairly straight and while sitting up, try to rest the tops of your forearms on your upper thighs.   - Raising your feet with a step stool/squatty potty can be helpful to improve the angle that allows your stool to pass through the rectum. - Relax the rectum feeling it bulge toward the toilet  water.  If you feel your rectum raising toward your body, you are contracting rather than relaxing. - Breathe in and slowly exhale. Belly breath by expanding your belly towards your belly button. Keep belly expanded as you gently direct pressure down and back to the anus.  A low pitched GRRR sound can assist with increasing intra-abdominal pressure.  (Can also trying to blow on a pinwheel and make it move,  this helps with the same belly breathing) - Repeat 3-4 times. If unsuccessful, contract the pelvic floor to restore normal tone and get off the toilet.  Avoid excessive straining. - To reduce excessive wiping by teaching your anus to normally contract, place hands on outer aspect of knees and resist knee movement outward.  Hold 5-10 second then place hands just inside of knees and resist inward movement of knees.  Hold 5 seconds.  Repeat a few times each way.  Go to the ER if unable to pass gas, severe AB pain, unable to hold down food, any shortness of breath of chest pain.  You have been scheduled for an endoscopy and colonoscopy. Please follow the written instructions given to you at your visit today.  If you use inhalers (even only as needed), please bring them with you on the day of your procedure.  DO NOT TAKE 7 DAYS PRIOR TO TEST- Trulicity (dulaglutide) Ozempic, Wegovy (semaglutide) Mounjaro (tirzepatide) Bydureon Bcise (exanatide extended release)  DO NOT TAKE 1 DAY PRIOR TO YOUR TEST Rybelsus (semaglutide) Adlyxin (lixisenatide) Victoza (liraglutide) Byetta (exanatide) ___________________________________________________________________________   Due to recent changes in healthcare laws, you may see the results of your imaging and laboratory studies on MyChart before your provider has had a chance to review them.  We understand that in some cases there may be results that are confusing or concerning to you. Not all laboratory results come back in the same time frame and the provider may be waiting for multiple results in order to interpret others.  Please give us  48 hours in order for your provider to thoroughly review all the results before contacting the office for clarification of your results.    I appreciate the  opportunity to care for you  Thank You   [facility]

## 2024-04-30 NOTE — Progress Notes (Addendum)
 04/30/2024 Seth Torres [id number] [birth year]  Referring provider: Willo Mini, NP Primary GI doctor: Dr. Suzann  ASSESSMENT AND PLAN:  Constipation/diarrhea, has had elevated CRP, one episode of rectal bleeding with nutritional deficiencies (B12 folate) corrected with supplementation 01/09/2024 CRP 1.3, sed rate normal 12/25/2023 iron 92 ferritin 105 Hemoccult cards negative No family history of colon cancer - Increase fiber/ water intake, decrease caffeine, increase activity level. - check celiac panel - check ESR/CRP -Will add on Benefiber -Scheduled colonoscopy with EGD to evaluate, We have discussed the risks of bleeding, infection, perforation, medication reactions, and remote risk of death associated with colonoscopy. All questions were answered and the patient acknowledges these risk and wishes to proceed. - consider SIBO testing if negative  GERD x years, daily, worse with acidic foods, epigastric pain Worse with tobacco but he has stopped Intermittent dysphagia with dry food No weight loss, normal appetite, no N/V 11/22/2021 H. pylori breath test negative Aleve once every 2 weeks, fluctuating ETOH use No history of stomach cancer - check celiac -alginate therapy given -Lifestyle changes discussed, avoid NSAIDS, ETOH, hand out given to the patient -Weight loss discussed with the patient -Schedule EGD at [facility] to evaluate GERD, esophagitis, hiatal hernia,H pylori. I discussed risks of EGD with patient today, including risk of sedation, bleeding or perforation. Patient provides understanding and gave verbal consent to proceed.  B12 deficiency/folate deficiency Improving with treatment Check intrinsic factors, antiparietal  Homocysteine Schedule EGD  Vitamin D  deficiency Continue supplements  Beta thalassemia minor with splenomegaly Chronic microcytosis 01/14/24 [place]  B mildly enlarged spleen unremarkable liver normal gallbladder  I have reviewed the clinic note as  outlined by Alan Coombs, PA and agree with the assessment, plan and medical decision making.  Seth Torres presents to the office for evaluation of constipation, diarrhea, GERD, elevated CRP, vitamin B12/folate/vitamin D  deficiency.  Agree with labs, EGD and colonoscopy.  [name], MD    Patient Care Team: Willo Mini, NP as PCP - General (Nurse Practitioner)  HISTORY OF PRESENT ILLNESS: 28 y.o. male with a past medical history listed below presents for evaluation of change in bowel habits and nutritional deficiencies.   Patient has been following with Lauraine Dais PA with oncology for low MCV secondary to beta thalassemia minor, found to have B12 vitamin D  and folic acid  deficiency.  Has had ongoing fatigue since severe case of mononucleosis in 2019.  Had splenomegaly thought secondary to beta thalassemia minor/mono.  Discussed the use of AI scribe software for clinical note transcription with the patient, who gave verbal consent to proceed.  History of Present Illness   Seth Torres is a 28 year old male with GERD who presents with gastrointestinal symptoms.  He has a history of GERD diagnosed in December 2024, with daily symptoms of acid reflux. He experiences a burning sensation in his throat, particularly after consuming acidic foods like pineapple and chicken pot pie. Tobacco use previously exacerbated his symptoms, but he has since quit, leading to improvement. No significant trouble swallowing, though he occasionally experiences difficulty with dry foods like fries and crackers. No nausea, vomiting, decreased appetite, or weight loss.  His bowel habits are variable, with alternating diarrhea and constipation. He experiences abdominal pain above the belly button, particularly in the mornings, which is worsened by drinking soda. He has a daily bowel movement, with stool consistency ranging from formed to loose. Eating can prompt an immediate need to use the bathroom, and he has  experienced maroon-colored water in the toilet,  though stool tests for blood have been negative.  He has a history of vitamin B12, vitamin D , and folic acid  deficiencies, which have improved with supplementation. He also has beta thalassemia minor. His headaches have lessened since correcting these deficiencies.  He consumes alcohol infrequently, with occasional episodes of binge drinking, which worsens his acid reflux. He denies any family history of colon cancer, stomach cancer, or autoimmune diseases. He has quit tobacco use, which has significantly improved his GERD symptoms. He tries to limit alcohol consumption due to its negative impact on his gastrointestinal symptoms.        He  reports that he has quit smoking. His smoking use included cigarettes. He started smoking about 13 years ago. He has a 6.8 pack-year smoking history. He has quit using smokeless tobacco.  His smokeless tobacco use included chew. He reports that he does not currently use alcohol. He reports that he does not currently use drugs.  RELEVANT GI HISTORY, IMAGING AND LABS: Results   LABS   Iron: Normal (12/2023)   Hemoccult: Negative (12/2023)   H. pylori breath test: Negative (11/2021)   C-reactive protein (CRP): Slightly elevated (12/2023)      CBC    Component Value Date/Time   WBC 7.2 02/10/2024 1435   WBC 11.8 (H) 11/11/2018 1425   RBC 5.86 (H) 02/10/2024 1435   RBC 5.93 (H) 02/10/2024 1435   HGB 15.1 02/10/2024 1435   HGB 14.7 12/25/2023 1406   HCT 44.4 02/10/2024 1435   HCT 44.9 12/25/2023 1406   PLT 208 02/10/2024 1435   PLT 180 12/25/2023 1406   MCV 75.8 (L) 02/10/2024 1435   MCV 78 (L) 12/25/2023 1406   MCH 25.8 (L) 02/10/2024 1435   MCHC 34.0 02/10/2024 1435   RDW 13.5 02/10/2024 1435   RDW 14.7 12/25/2023 1406   LYMPHSABS 2.7 02/10/2024 1435   LYMPHSABS 2.1 12/25/2023 1406   MONOABS 0.6 02/10/2024 1435   EOSABS 0.1 02/10/2024 1435   EOSABS 0.1 12/25/2023 1406   BASOSABS 0.0 02/10/2024  1435   BASOSABS 0.1 12/25/2023 1406   Recent Labs    11/08/23 1136 12/25/23 1406 01/09/24 1335 02/10/24 1435  HGB 15.8 14.7 14.5 15.1    CMP     Component Value Date/Time   NA 137 02/10/2024 1435   NA 140 12/25/2023 1406   K 4.0 02/10/2024 1435   CL 103 02/10/2024 1435   CO2 26 02/10/2024 1435   GLUCOSE 76 02/10/2024 1435   BUN 14 02/10/2024 1435   BUN 16 12/25/2023 1406   CREATININE 1.16 02/10/2024 1435   CREATININE 1.08 03/23/2019 1901   CALCIUM 9.6 02/10/2024 1435   PROT 7.4 02/10/2024 1435   PROT 6.6 12/25/2023 1406   ALBUMIN 4.7 02/10/2024 1435   ALBUMIN 4.6 12/25/2023 1406   AST 15 02/10/2024 1435   ALT 16 02/10/2024 1435   ALKPHOS 79 02/10/2024 1435   BILITOT 0.4 02/10/2024 1435   GFRNONAA >60 02/10/2024 1435   GFRNONAA 98 03/23/2019 1901   GFRAA 113 03/23/2019 1901      Latest Ref Rng & Units 02/10/2024    2:35 PM 01/09/2024    1:35 PM 12/25/2023    2:06 PM  Hepatic Function  Total Protein 6.5 - 8.1 g/dL 7.4  6.7  6.6   Albumin 3.5 - 5.0 g/dL 4.7  4.6  4.6   AST 15 - 41 U/L 15  15  17    ALT 0 - 44 U/L 16  13  14  Alk Phosphatase 38 - 126 U/L 79  79  97   Total Bilirubin 0.0 - 1.2 mg/dL 0.4  0.5  0.3       Current Medications:       Current Outpatient Medications (Hematological):    cyanocobalamin  (VITAMIN B12) 1000 MCG tablet, Take 1,000 mcg by mouth 2 (two) times daily.   folic acid  (FOLVITE ) 1 MG tablet, Take 1 tablet (1 mg total) by mouth daily.  Current Outpatient Medications (Other):    ergocalciferol  (VITAMIN D2) 1.25 MG (50000 UT) capsule, Take 1 capsule (50,000 Units total) by mouth once a week.  Medical History:  Past Medical History:  Diagnosis Date   Ganglion cyst of dorsum of right wrist    Mononucleosis    Allergies: No Known Allergies   Surgical History:  He  has a past surgical history that includes Ganglion cyst excision. Family History:  His family history includes Healthy in his father and mother.  REVIEW OF  SYSTEMS  : All other systems reviewed and negative except where noted in the History of Present Illness.  PHYSICAL EXAM: BP 114/76   Pulse 75   Ht 5' 11 (1.803 m)   Wt 240 lb (108.9 kg)   BMI 33.47 kg/m  Physical Exam   GENERAL APPEARANCE: Well nourished, in no apparent distress. HEENT: No cervical lymphadenopathy, unremarkable thyroid , sclerae anicteric, conjunctiva pink. RESPIRATORY: Respiratory effort normal, breath sounds clear to auscultation bilaterally without rales, rhonchi, or wheezing. CARDIO: Regular rate and rhythm with no murmurs, rubs, or gallops, peripheral pulses intact. ABDOMEN: Soft, non-distended, active bowel sounds in all four quadrants, epigastric and left lower quadrant tenderness, no rebound, no mass appreciated. RECTAL: Declines. MUSCULOSKELETAL: Full range of motion, normal gait, without edema. SKIN: Dry, intact without rashes or lesions. No jaundice. NEURO: Alert, oriented, no focal deficits. PSYCH: Cooperative, normal mood and affect.      Alan JONELLE Coombs, PA-C 11:34 AM

## 2024-05-01 ENCOUNTER — Ambulatory Visit: Payer: Self-pay | Admitting: Physician Assistant

## 2024-05-02 ENCOUNTER — Telehealth: Admitting: Family Medicine

## 2024-05-02 DIAGNOSIS — J069 Acute upper respiratory infection, unspecified: Secondary | ICD-10-CM | POA: Diagnosis not present

## 2024-05-02 NOTE — Patient Instructions (Signed)
 Noelle Dk, thank you for joining Roosvelt Mater, PA-C for today's virtual visit.  While this provider is not your primary care provider (PCP), if your PCP is located in our provider database this encounter information will be shared with them immediately following your visit.   A [place] MyChart account gives you access to today's visit and all your visits, tests, and labs performed at [facility]  click here if you don't have a [facility] MyChart account or go to [email or web address]  Consent: (Patient) Seth Torres provided verbal consent for this virtual visit at the beginning of the encounter.  Current Medications:  Current Outpatient Medications:    cyanocobalamin  (VITAMIN B12) 1000 MCG tablet, Take 1,000 mcg by mouth 2 (two) times daily., Disp: , Rfl:    ergocalciferol  (VITAMIN D2) 1.25 MG (50000 UT) capsule, Take 1 capsule (50,000 Units total) by mouth once a week., Disp: 4 capsule, Rfl: 3   folic acid  (FOLVITE ) 1 MG tablet, Take 1 tablet (1 mg total) by mouth daily., Disp: 90 tablet, Rfl: 3   Medications ordered in this encounter:  No orders of the defined types were placed in this encounter.    *If you need refills on other medications prior to your next appointment, please contact your pharmacy*  Follow-Up: Call back or seek an in-person evaluation if the symptoms worsen or if the condition fails to improve as anticipated.  [facility] [phone]  Other Instructions Upper Respiratory Infection, Adult An upper respiratory infection (URI) is a common viral infection of the nose, throat, and upper air passages that lead to the lungs. The most common type of URI is the common cold. URIs usually get better on their own, without medical treatment. What are the causes? A URI is caused by a virus. You may catch a virus by: Breathing in droplets from an infected person's cough or sneeze. Touching something that has been exposed to the  virus (is contaminated) and then touching your mouth, nose, or eyes. What increases the risk? You are more likely to get a URI if: You are very young or very old. You have close contact with others, such as at work, school, or a health care facility. You smoke. You have long-term (chronic) heart or lung disease. You have a weakened disease-fighting system (immune system). You have nasal allergies or asthma. You are experiencing a lot of stress. You have poor nutrition. What are the signs or symptoms? A URI usually involves some of the following symptoms: Runny or stuffy (congested) nose. Cough. Sneezing. Sore throat. Headache. Fatigue. Fever. Loss of appetite. Pain in your forehead, behind your eyes, and over your cheekbones (sinus pain). Muscle aches. Redness or irritation of the eyes. Pressure in the ears or face. How is this diagnosed? This condition may be diagnosed based on your medical history and symptoms, and a physical exam. Your health care provider may use a swab to take a mucus sample from your nose (nasal swab). This sample can be tested to determine what virus is causing the illness. How is this treated? URIs usually get better on their own within 7-10 days. Medicines cannot cure URIs, but your health care provider may recommend certain medicines to help relieve symptoms, such as: Over-the-counter cold medicines. Cough suppressants. Coughing is a type of defense against infection that helps to clear the respiratory system, so take these medicines only as recommended by your health care provider. Fever-reducing medicines. Follow these instructions at home: Activity Rest as needed. If  you have a fever, stay home from work or school until your fever is gone or until your health care provider says your URI cannot spread to other people (is no longer contagious). Your health care provider may have you wear a face mask to prevent your infection from spreading. Relieving  symptoms Gargle with a mixture of salt and water 3-4 times a day or as needed. To make salt water, completely dissolve -1 tsp (3-6 g) of salt in 1 cup (237 mL) of warm water. Use a cool-mist humidifier to add moisture to the air. This can help you breathe more easily. Eating and drinking  Drink enough fluid to keep your urine pale yellow. Eat soups and other clear broths. General instructions  Take over-the-counter and prescription medicines only as told by your health care provider. These include cold medicines, fever reducers, and cough suppressants. Do not use any products that contain nicotine or tobacco. These products include cigarettes, chewing tobacco, and vaping devices, such as e-cigarettes. If you need help quitting, ask your health care provider. Stay away from secondhand smoke. Stay up to date on all immunizations, including the yearly (annual) flu vaccine. Keep all follow-up visits. This is important. How to prevent the spread of infection to others URIs can be contagious. To prevent the infection from spreading: Wash your hands with soap and water for at least 20 seconds. If soap and water are not available, use hand sanitizer. Avoid touching your mouth, face, eyes, or nose. Cough or sneeze into a tissue or your sleeve or elbow instead of into your hand or into the air.  Contact a health care provider if: You are getting worse instead of better. You have a fever or chills. Your mucus is brown or red. You have yellow or brown discharge coming from your nose. You have pain in your face, especially when you bend forward. You have swollen neck glands. You have pain while swallowing. You have white areas in the back of your throat. Get help right away if: You have shortness of breath that gets worse. You have severe or persistent: Headache. Ear pain. Sinus pain. Chest pain. You have chronic lung disease along with any of the following: Making high-pitched whistling  sounds when you breathe, most often when you breathe out (wheezing). Prolonged cough (more than 14 days). Coughing up blood. A change in your usual mucus. You have a stiff neck. You have changes in your: Vision. Hearing. Thinking. Mood. These symptoms may be an emergency. Get help right away. Call 911. Do not wait to see if the symptoms will go away. Do not drive yourself to the hospital. Summary An upper respiratory infection (URI) is a common infection of the nose, throat, and upper air passages that lead to the lungs. A URI is caused by a virus. URIs usually get better on their own within 7-10 days. Medicines cannot cure URIs, but your health care provider may recommend certain medicines to help relieve symptoms. This information is not intended to replace advice given to you by your health care provider. Make sure you discuss any questions you have with your health care provider. Document Revised: 05/03/2021 Document Reviewed: 05/03/2021 Elsevier Patient Education  2024 Elsevier Inc.   If you have been instructed to have an in-person evaluation today at a local Urgent Care facility, please use the link below. It will take you to a list of all of our available [facility], including address, phone number and hours of operation. Please  do not delay care.  [facility]  If you or a family member do not have a primary care provider, use the link below to schedule a visit and establish care. When you choose a [facility] primary care physician or advanced practice provider, you gain a long-term partner in health. Find a Primary Care Provider  Learn more about [facility]'s in-office and virtual care options: [place] - Get Care Now

## 2024-05-02 NOTE — Progress Notes (Signed)
 Virtual Visit Consent   Seth Torres, you are scheduled for a virtual visit with a [facility] provider today. Just as with appointments in the office, your consent must be obtained to participate. Your consent will be active for this visit and any virtual visit you may have with one of our providers in the next 365 days. If you have a MyChart account, a copy of this consent can be sent to you electronically.  As this is a virtual visit, video technology does not allow for your provider to perform a traditional examination. This may limit your provider's ability to fully assess your condition. If your provider identifies any concerns that need to be evaluated in person or the need to arrange testing (such as labs, EKG, etc.), we will make arrangements to do so. Although advances in technology are sophisticated, we cannot ensure that it will always work on either your end or our end. If the connection with a video visit is poor, the visit may have to be switched to a telephone visit. With either a video or telephone visit, we are not always able to ensure that we have a secure connection.  By engaging in this virtual visit, you consent to the provision of healthcare and authorize for your insurance to be billed (if applicable) for the services provided during this visit. Depending on your insurance coverage, you may receive a charge related to this service.  I need to obtain your verbal consent now. Are you willing to proceed with your visit today? Seth Torres has provided verbal consent on 05/02/2024 for a virtual visit (video or telephone). Seth Torres, [place]  Date: 05/02/2024 10:04 AM   Virtual Visit via Video Note   I, Seth Torres, connected with  Seth Torres  ([id number], 12/17/1995) on 05/02/24 at 10:00 AM EDT by a video-enabled telemedicine application and verified that I am speaking with the correct person using two identifiers.  Location: Patient: Virtual Visit Location Patient:  Home Provider: Virtual Visit Location Provider: Home Office   I discussed the limitations of evaluation and management by telemedicine and the availability of in person appointments. The patient expressed understanding and agreed to proceed.    History of Present Illness: Seth Torres is a 28 y.o. who identifies as a male who was assigned male at birth, and is being seen today for c/o honestly just needs a doctors note.  Pt states has been sick for a couple of days.  Pt c/o runny nose, sneezing, congestion and a lot of mucus and feeling tired.  Pt states every body at home is sick as well.  Pt states symptoms started on Wednesday and worsened on Thursday. Pt taking night and day quil. Pt states next work day is tomorrow. Pt states he is trying to avoid being out to many days because his job is really strict.   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 with body mass index (BMI) of 34.0 to 34.9 in adult 02/27/2021   Need for HPV vaccination 02/27/2021   Recurrent infections 11/13/2018   Bilateral impacted cerumen 05/18/2018   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   Acute tonsillitis due to infectious mononucleosis 01/06/2018   Cardiac murmur 01/02/2018   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/2018   Closed nondisplaced avulsion fracture of left talus 03/07/2017   Ganglion cyst of dorsum of right wrist 11/29/2016    Allergies: No Known Allergies Medications:  Current Outpatient Medications:    cyanocobalamin  (VITAMIN B12) 1000 MCG tablet, Take 1,000 mcg by mouth 2 (two) times daily., Disp: , Rfl:     ergocalciferol  (VITAMIN D2) 1.25 MG (50000 UT) capsule, Take 1 capsule (50,000 Units total) by mouth once a week., Disp: 4 capsule, Rfl: 3   folic acid  (FOLVITE ) 1 MG tablet, Take 1 tablet (1 mg total) by mouth daily., Disp: 90 tablet, Rfl: 3  Observations/Objective: Patient is well-developed, well-nourished in no acute distress.  Resting comfortably at home.  Head is normocephalic, atraumatic.  No labored breathing.  Speech is clear and coherent with logical content.  Patient is alert and oriented at baseline.    Assessment and Plan: 1. Upper respiratory tract infection, unspecified type (Primary)  -Work noted provided for Pt -Continue conservative treatments -Pt to F/U with urgent care or PCP for worsening symptoms.  Follow Up Instructions: I discussed the assessment and treatment plan with the patient. The patient was provided an opportunity to ask questions and all were answered. The patient agreed with the plan and demonstrated an understanding of the instructions.  A copy of instructions were sent to the patient via MyChart unless otherwise noted below.    The patient was advised to call back or seek an in-person evaluation if the symptoms worsen or if the condition fails to improve as anticipated.    Seth Mater, PA-C

## 2024-05-04 LAB — INTRINSIC FACTOR ANTIBODIES: Intrinsic Factor: NEGATIVE

## 2024-05-04 LAB — IGA: Immunoglobulin A: 179 mg/dL (ref 47–310)

## 2024-05-04 LAB — TISSUE TRANSGLUTAMINASE, IGA: (tTG) Ab, IgA: <1 U/mL

## 2024-05-04 LAB — HOMOCYSTEINE: Homocysteine: 7 umol/L (ref <=12.9)

## 2024-05-04 LAB — ANTI-PARIETAL ANTIBODY: PARIETAL CELL AB SCREEN: NEGATIVE

## 2024-05-07 ENCOUNTER — Ambulatory Visit (INDEPENDENT_AMBULATORY_CARE_PROVIDER_SITE_OTHER): Payer: BLUE CROSS/BLUE SHIELD

## 2024-05-07 VITALS — BP 110/70 | HR 72 | Temp 98.3°F

## 2024-05-07 DIAGNOSIS — Z23 Encounter for immunization: Secondary | ICD-10-CM | POA: Diagnosis not present

## 2024-05-07 NOTE — Progress Notes (Signed)
 HPV Vaccine #3- admin IM left deltoid.  Injection was tolerated well by the patient. (See MAR for injection details)

## 2024-05-19 ENCOUNTER — Telehealth: Payer: Self-pay | Admitting: Physician Assistant

## 2024-05-19 NOTE — Telephone Encounter (Signed)
 Good Afternoon Dr. Suzann, I received a call from this patient requesting to reschedule his Endo colon procedure for August the 13 th due to him not have a care partner and having no time off for work. Patient was reschedule for October the 8 th and added a pre-visit appointment with the nurse to go over his instruction. Please advise.

## 2024-05-20 NOTE — Telephone Encounter (Signed)
Patient has been advised of recommendations. 

## 2024-05-27 ENCOUNTER — Encounter: Admitting: Pediatrics

## 2024-06-16 ENCOUNTER — Encounter: Payer: Self-pay | Admitting: Sports Medicine

## 2024-07-08 ENCOUNTER — Ambulatory Visit (AMBULATORY_SURGERY_CENTER)

## 2024-07-08 VITALS — Ht 71.0 in | Wt 245.0 lb

## 2024-07-08 DIAGNOSIS — K219 Gastro-esophageal reflux disease without esophagitis: Secondary | ICD-10-CM

## 2024-07-08 DIAGNOSIS — E538 Deficiency of other specified B group vitamins: Secondary | ICD-10-CM

## 2024-07-08 NOTE — Progress Notes (Signed)
 Pre visit completed via phone call; Patient verified name, DOB, and address; No egg or soy allergy known to patient;  No issues known to pt with past sedation with any surgeries or procedures; Patient denies ever being told they had issues or difficulty with intubation;  No FH of Malignant Hyperthermia; Pt is not on diet pills; Pt is not on home 02;  Pt is not on blood thinners;  Pt denies issues with constipation  No A fib or A flutter; Have any cardiac testing pending--NO Insurance verified during PV appt--- BCBS Pt can ambulate without assistance;  Pt denies use of chewing tobacco Discussed diabetic/weight loss medication holds; Discussed NSAID holds; Checked BMI to be less than 50; Pt instructed to use [employer] or GoodRx for a price reduction on prep;  PATIENT reports he already has the prep at home;  Patient's chart reviewed by Norleen Schillings CNRA prior to previsit and patient appropriate for the [facility]; Pre visit completed and red dot placed by patient's name on their procedure day (on provider's schedule);  Instructions sent to MyChart per patient request;

## 2024-07-22 ENCOUNTER — Encounter: Admitting: Pediatrics

## 2024-08-11 ENCOUNTER — Ambulatory Visit: Admitting: Medical Oncology

## 2024-08-11 ENCOUNTER — Inpatient Hospital Stay: Payer: Self-pay

## 2024-10-09 ENCOUNTER — Encounter: Payer: Self-pay | Admitting: Pediatrics

## 2024-10-12 ENCOUNTER — Ambulatory Visit
Admission: EM | Admit: 2024-10-12 | Discharge: 2024-10-12 | Disposition: A | Discharge status: Home / Self Care | Attending: Family Medicine | Admitting: Family Medicine

## 2024-10-12 ENCOUNTER — Other Ambulatory Visit: Payer: Self-pay

## 2024-10-12 DIAGNOSIS — R059 Cough, unspecified: Secondary | ICD-10-CM | POA: Diagnosis not present

## 2024-10-12 DIAGNOSIS — J309 Allergic rhinitis, unspecified: Secondary | ICD-10-CM

## 2024-10-12 DIAGNOSIS — J069 Acute upper respiratory infection, unspecified: Secondary | ICD-10-CM

## 2024-10-12 MED ORDER — PREDNISONE 10 MG (21) PO TBPK: ORAL_TABLET | Freq: Every day | ORAL | 0 refills | Status: AC

## 2024-10-12 MED ORDER — FEXOFENADINE HCL 180 MG PO TABS: 180.0000 mg | ORAL_TABLET | Freq: Every day | ORAL | 0 refills | Status: AC

## 2024-10-12 MED ORDER — AMOXICILLIN-POT CLAVULANATE 875-125 MG PO TABS: 1.0000 | ORAL_TABLET | Freq: Two times a day (BID) | ORAL | 0 refills | Status: AC

## 2024-10-12 NOTE — ED Provider Notes (Signed)
 " [facility]    CSN: [id number] Arrival date & time: 10/12/24  9185      History   Chief Complaint Chief Complaint  Patient presents with   Sore Throat   Cough    HPI Fenix Rorke is a 28 y.o. male.   HPI 28 year old male presents with productive cough, chest congestion and sore throat for 10 days.  PMH significant for obesity, daily cigarette smoker, and GERD.  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 with body mass index (BMI) of 34.0 to 34.9 in adult 02/27/2021   Need for HPV vaccination 02/27/2021   Recurrent infections 11/13/2018   Bilateral impacted cerumen 05/18/2018   Epstein Barr virus infection 05/18/2018   Acute tonsillitis due to infectious mononucleosis 01/06/2018   Cardiac murmur 01/02/2018   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 Authorizing Provider  amoxicillin -clavulanate (AUGMENTIN ) 875-125 MG tablet Take 1 tablet by mouth every 12 (twelve) hours. 10/12/24  Yes Teddy Sharper, FNP  fexofenadine [facility] ALLERGY) 180 MG tablet Take 1 tablet (180 mg total) by mouth daily. 10/12/24 10/27/24 Yes Teddy Sharper, FNP  predniSONE  (STERAPRED UNI-PAK 21 TAB) 10 MG (21) TBPK  tablet Take by mouth daily. Take 6 tabs by mouth daily  for 2 days, then 5 tabs for 2 days, then 4 tabs for 2 days, then 3 tabs for 2 days, 2 tabs for 2 days, then 1 tab by mouth daily for 2 days 10/12/24  Yes Teddy Sharper, FNP  ergocalciferol  (VITAMIN D2) 1.25 MG (50000 UT) capsule Take 1 capsule (50,000 Units total) by mouth once a week. 01/20/24   Tonette Lauraine HERO, PA-C    Family History Family History  Problem Relation Age of Onset   Healthy Mother    Healthy Father    Colon polyps Neg Hx    Colon cancer Neg Hx    Esophageal cancer Neg Hx    Stomach cancer Neg Hx    Rectal cancer Neg Hx     Social History Social History[1]   Allergies   Patient has no known allergies.   Review of Systems Review of Systems  HENT:  Positive for congestion.   Respiratory:  Positive for chest tightness.   All other systems reviewed and are negative.    Physical Exam Triage Vital Signs ED Triage Vitals  Encounter Vitals Group     BP      Girls Systolic BP Percentile      Girls Diastolic BP Percentile      Boys Systolic BP Percentile      Boys Diastolic BP Percentile      Pulse  Resp      Temp      Temp src      SpO2      Weight      Height      Head Circumference      Peak Flow      Pain Score      Pain Loc      Pain Education      Exclude from Growth Chart    No data found.  Updated Vital Signs BP 129/77 (BP Location: Right Arm)   Pulse 90   Temp 98.8 F (37.1 C) (Oral)   Resp 18   Ht 5' 11 (1.803 m)   Wt 230 lb (104.3 kg)   SpO2 97%   BMI 32.08 kg/m    Physical Exam Vitals and nursing note reviewed.  Constitutional:      Appearance: Normal appearance. He is obese. He is ill-appearing.  HENT:     Head: Normocephalic and atraumatic.     Right Ear: Tympanic membrane and external ear normal.     Left Ear: Tympanic membrane and external ear normal.     Ears:     Comments: Significant eustachian tube dysfunction noted bilaterally    Mouth/Throat:      Mouth: Mucous membranes are moist.     Pharynx: Oropharynx is clear.     Comments: Significant amount of clear drainage of posterior oropharynx noted Eyes:     Extraocular Movements: Extraocular movements intact.     Conjunctiva/sclera: Conjunctivae normal.     Pupils: Pupils are equal, round, and reactive to light.  Cardiovascular:     Rate and Rhythm: Normal rate and regular rhythm.     Heart sounds: Normal heart sounds.  Pulmonary:     Effort: Pulmonary effort is normal.     Breath sounds: Normal breath sounds. No wheezing, rhonchi or rales.     Comments: Frequent nonproductive/productive cough on exam Musculoskeletal:        General: Normal range of motion.  Skin:    General: Skin is warm and dry.  Neurological:     General: No focal deficit present.     Mental Status: He is alert and oriented to person, place, and time. Mental status is at baseline.  Psychiatric:        Mood and Affect: Mood normal.        Behavior: Behavior normal.      UC Treatments / Results  Labs (all labs ordered are listed, but only abnormal results are displayed) Labs Reviewed - No data to display  EKG   Radiology No results found.  Procedures Procedures (including critical care time)  Medications Ordered in UC Medications - No data to display  Initial Impression / Assessment and Plan / UC Course  I have reviewed the triage vital signs and the nursing notes.  Pertinent labs & imaging results that were available during my care of the patient were reviewed by me and considered in my medical decision making (see chart for details).     MDM: 1.  Acute URI-Rx'd Augmentin  875/125 mg tablet: Take 1 tablet twice daily x 7 days; 2.  Cough, unspecified type-Rx'd Sterapred Unipak (42 tab 10 mg taper): Take as directed; 3.  Allergic rhinitis, unspecified seasonality, unspecified trigger-Rx'd Allegra 180 mg tablet, take 1 tablet daily x 5 days. Advised patient take medications as directed with food  to completion.  Advised to take prednisone  and Allegra with first dose of Augmentin .  Advised may use  Allegra as needed after 5 days for recurrent postnasal drainage/drip/runny nose/sneezing.  Encouraged to increase daily water intake to 64 ounces per day while taking these medications.  Advised if symptoms worsen and are unresolved please follow-up your PCP or here for further evaluation.  Work note returning on 10/16/2024 provided to patient prior to discharge per his request. Final Clinical Impressions(s) / UC Diagnoses   Final diagnoses:  Cough, unspecified type  Acute URI  Allergic rhinitis, unspecified seasonality, unspecified trigger     Discharge Instructions      Advised patient take medications as directed with food to completion.  Advised to take prednisone  and Allegra with first dose of Augmentin .  Advised may use Allegra as needed after 5 days for recurrent postnasal drainage/drip/runny nose/sneezing.  Encouraged to increase daily water intake to 64 ounces per day while taking these medications.  Advised if symptoms worsen and are unresolved please follow-up your PCP or here for further evaluation.     ED Prescriptions     Medication Sig Dispense Auth. Provider   amoxicillin -clavulanate (AUGMENTIN ) 875-125 MG tablet Take 1 tablet by mouth every 12 (twelve) hours. 14 tablet Lannie Heaps, FNP   fexofenadine (ALLEGRA ALLERGY) 180 MG tablet Take 1 tablet (180 mg total) by mouth daily. 15 tablet Cyrene Gharibian, FNP   predniSONE  (STERAPRED UNI-PAK 21 TAB) 10 MG (21) TBPK tablet Take by mouth daily. Take 6 tabs by mouth daily  for 2 days, then 5 tabs for 2 days, then 4 tabs for 2 days, then 3 tabs for 2 days, 2 tabs for 2 days, then 1 tab by mouth daily for 2 days 42 tablet Teddy Sharper, FNP      PDMP not reviewed this encounter.    [1]  Social History Tobacco Use   Smoking status: Former    Current packs/day: 0.50    Average packs/day: 0.5 packs/day for 14.0 years (7.0 ttl  pk-yrs)    Types: Cigarettes    Start date: 2012   Smokeless tobacco: Former    Types: Chew   Tobacco comments:    Quit cigarettes last October 2024.    Tobacco free nicotine pouches (go in the lip) (01/26/24)  Vaping Use   Vaping status: Former  Substance Use Topics   Alcohol use: Not Currently   Drug use: Not Currently    Frequency: 7.0 times per week    Types: Marijuana     Teddy Sharper, FNP 10/12/24 0920  "

## 2024-10-12 NOTE — Discharge Instructions (Addendum)
 Advised patient take medications as directed with food to completion.  Advised to take prednisone  and Allegra with first dose of Augmentin .  Advised may use Allegra as needed after 5 days for recurrent postnasal drainage/drip/runny nose/sneezing.  Encouraged to increase daily water intake to 64 ounces per day while taking these medications.  Advised if symptoms worsen and are unresolved please follow-up your PCP or here for further evaluation.

## 2024-10-12 NOTE — ED Triage Notes (Signed)
 Pt presenting with c/o productive cough, chest congestion, sore throat, runny nose and SOB x 10 days. Pt stated he has been using DayQuil , last taken this AM with minimal effectiveness.

## 2024-11-06 ENCOUNTER — Telehealth: Payer: Self-pay

## 2024-11-06 NOTE — Telephone Encounter (Signed)
 RN attempted to contact patient about rescheduling his PV appt on Monday, 11/09/24 because of [facility] offices being closed d/t predicted bad weather. RN left vm informing patient of this and requested that patient call our offices to reschedule his PV.

## 2024-11-13 ENCOUNTER — Telehealth: Payer: Self-pay

## 2024-11-13 NOTE — Telephone Encounter (Signed)
 RN again attempted to contact patient about needing to reschedule PV prior to procedures; left vm asking patient to return the call to reschedule. Call was not returned. Procedures have been cancelled. Letter notifying patient of cancellation has been placed in the mail.

## 2024-11-23 ENCOUNTER — Encounter: Payer: Self-pay | Admitting: Pediatrics

## 2025-02-09 ENCOUNTER — Inpatient Hospital Stay: Payer: Self-pay

## 2025-02-09 ENCOUNTER — Inpatient Hospital Stay: Payer: Self-pay | Admitting: Medical Oncology
